# Patient Record
Sex: Female | Born: 1965 | Race: White | Hispanic: No | Marital: Single | Smoking: Current every day smoker
Health system: Southern US, Community
[De-identification: ages and names within clinical notes are randomized; demographics above are authoritative.]

## PROBLEM LIST (undated history)

## (undated) DIAGNOSIS — Z86010 Personal history of colonic polyps: Secondary | ICD-10-CM

## (undated) DIAGNOSIS — E119 Type 2 diabetes mellitus without complications: Secondary | ICD-10-CM

## (undated) DIAGNOSIS — K5901 Slow transit constipation: Secondary | ICD-10-CM

## (undated) HISTORY — DX: Personal history of colonic polyps: Z86.010

## (undated) HISTORY — DX: Slow transit constipation: K59.01

## (undated) HISTORY — PX: ABDOMINAL HYSTERECTOMY: SHX81

## (undated) HISTORY — PX: HERNIA REPAIR: SHX51

## (undated) HISTORY — PX: COLONOSCOPY W/ BIOPSIES: SHX1374

## (undated) HISTORY — PX: OTHER SURGICAL HISTORY: SHX169

## (undated) HISTORY — PX: POLYPECTOMY: SHX149

## (undated) HISTORY — PX: CHOLECYSTECTOMY: SHX55

## (undated) HISTORY — DX: Type 2 diabetes mellitus without complications: E11.9

---

## 2013-01-09 ENCOUNTER — Ambulatory Visit (INDEPENDENT_AMBULATORY_CARE_PROVIDER_SITE_OTHER): Payer: 59 | Admitting: Family Medicine

## 2013-01-09 VITALS — BP 132/90 | HR 108 | Temp 98.1°F | Resp 20 | Ht 65.5 in | Wt 215.4 lb

## 2013-01-09 DIAGNOSIS — E119 Type 2 diabetes mellitus without complications: Secondary | ICD-10-CM

## 2013-01-09 DIAGNOSIS — I10 Essential (primary) hypertension: Secondary | ICD-10-CM

## 2013-01-09 DIAGNOSIS — E1149 Type 2 diabetes mellitus with other diabetic neurological complication: Secondary | ICD-10-CM

## 2013-01-09 DIAGNOSIS — E1140 Type 2 diabetes mellitus with diabetic neuropathy, unspecified: Secondary | ICD-10-CM

## 2013-01-09 DIAGNOSIS — E785 Hyperlipidemia, unspecified: Secondary | ICD-10-CM

## 2013-01-09 DIAGNOSIS — F17200 Nicotine dependence, unspecified, uncomplicated: Secondary | ICD-10-CM

## 2013-01-09 DIAGNOSIS — Z720 Tobacco use: Secondary | ICD-10-CM

## 2013-01-09 DIAGNOSIS — E1142 Type 2 diabetes mellitus with diabetic polyneuropathy: Secondary | ICD-10-CM

## 2013-01-09 LAB — POCT URINALYSIS DIPSTICK
Bilirubin, UA: NEGATIVE
Ketones, UA: NEGATIVE
Leukocytes, UA: NEGATIVE
Nitrite, UA: NEGATIVE

## 2013-01-09 LAB — COMPREHENSIVE METABOLIC PANEL
ALT: 30 U/L (ref 0–35)
AST: 28 U/L (ref 0–37)
Albumin: 4.2 g/dL (ref 3.5–5.2)
Alkaline Phosphatase: 106 U/L (ref 39–117)
BUN: 17 mg/dL (ref 6–23)
Calcium: 9.9 mg/dL (ref 8.4–10.5)
Chloride: 99 mEq/L (ref 96–112)
Creat: 0.73 mg/dL (ref 0.50–1.10)
Glucose, Bld: 338 mg/dL — ABNORMAL HIGH (ref 70–99)
Potassium: 4.6 mEq/L (ref 3.5–5.3)
Sodium: 133 mEq/L — ABNORMAL LOW (ref 135–145)
Total Protein: 7.4 g/dL (ref 6.0–8.3)

## 2013-01-09 LAB — LIPID PANEL
Cholesterol: 232 mg/dL — ABNORMAL HIGH (ref 0–200)
HDL: 34 mg/dL — ABNORMAL LOW (ref >39)
VLDL: 46 mg/dL — ABNORMAL HIGH (ref 0–40)

## 2013-01-09 LAB — GLUCOSE, POCT (MANUAL RESULT ENTRY): POC Glucose: 316 mg/dl — AB (ref 70–99)

## 2013-01-09 MED ORDER — SIMVASTATIN 20 MG PO TABS: 20.0000 mg | ORAL_TABLET | Freq: Every evening | ORAL | Status: DC

## 2013-01-09 MED ORDER — TRAMADOL HCL 50 MG PO TABS: 50.0000 mg | ORAL_TABLET | Freq: Four times a day (QID) | ORAL | Status: DC | PRN

## 2013-01-09 MED ORDER — SITAGLIPTIN PHOSPHATE 100 MG PO TABS: 100.0000 mg | ORAL_TABLET | Freq: Every day | ORAL | Status: DC

## 2013-01-09 MED ORDER — AMITRIPTYLINE HCL 10 MG PO TABS: 10.0000 mg | ORAL_TABLET | Freq: Every day | ORAL | Status: DC

## 2013-01-09 MED ORDER — METFORMIN HCL 500 MG PO TABS: 500.0000 mg | ORAL_TABLET | Freq: Two times a day (BID) | ORAL | Status: DC

## 2013-01-09 MED ORDER — LISINOPRIL 5 MG PO TABS: 5.0000 mg | ORAL_TABLET | Freq: Every day | ORAL | Status: DC

## 2013-01-09 NOTE — Patient Instructions (Addendum)
Take the metformin one twice daily for one week, then one in the morning and 2 in the evening for one week, then up to 2 twice daily. If having too much loose stools or abdominal pains dropback one pill.  Take the Januvia 100 mg one tablet daily for diabetes  Continue taking the lisinopril and simvastatin for blood pressure and cholesterol and protecting the diabetic kidney  Begin taking aspirin 81 mg one daily which can help protect the diabetic kidney and reduce risk of heart attacks  Use the tramadol if you have had pain in your feet  Use the amitriptyline one or 2 at bedtime for burning in the feet  Return in 2 months, early December  Work on smoking cecession

## 2013-01-09 NOTE — Progress Notes (Signed)
Subjective: 47 year old lady who is here for a diabetic evaluation. 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 When she had her card done yesterday her sugar in her urine was greater than 2000. She had been on medication but has not get to her doctor and has not been labs from last month or so. Her doctors in [employer] and she works up here since he is like she needs a Armed forces operational officer area physician that she can get to during her workdays. She gets some exercise, not a lot of regular basis. She is supposed to be on cholesterol and blood pressure medications also, but has not been taking them. She had gestational diabetes 5 years ago, then had normal sugar post childbirth. Later she redeveloped her diabetes.  His family history for diabetes in her paternal grandmother and a couple of aunts and her sister.  Review of systems shows she has occasional headache. She gets some mild blurring of vision intermittently. No major problems with dry mouth. No chest pains palpitations. No respiratory problems. No GI or GU complaints. No muscular skeletal complaints. She has had some foot pain problems for which he occasionally takes medicines  Objective: Overweight lady in no major acute distress. Fundi appear benign with discs normal. No retinopathy was noted. Throat clear. Neck supple without nodes or thyromegaly. No carotid bruits. Chest is clear to auscultation. Heart regular without murmur scalp slight redness. And nontender. No ankle edema.  Assessment: Uncontrolled diabetes mellitus Probable neuropathy History of hyperlipidemia History of hypertension  Plan: Check labs and proceed from there  Results for orders placed in visit on 01/09/13  POCT URINALYSIS DIPSTICK      Result Value Range   Color, UA yellow     Clarity, UA clear     Glucose, UA >=1000     Bilirubin, UA neg     Ketones, UA neg     Spec Grav, UA >=1.030     Blood, UA large      pH, UA 5.0     Protein, UA 30     Urobilinogen, UA 0.2     Nitrite, UA neg     Leukocytes, UA Negative    GLUCOSE, POCT (MANUAL RESULT ENTRY)      Result Value Range   POC Glucose 316 (*) 70 - 99 mg/dl  POCT GLYCOSYLATED HEMOGLOBIN (HGB A1C)      Result Value Range   Hemoglobin A1C 10.1

## 2013-01-10 ENCOUNTER — Encounter: Payer: Self-pay | Admitting: Family Medicine

## 2013-01-10 LAB — MICROALBUMIN, URINE: Microalb, Ur: 18.5 mg/dL — ABNORMAL HIGH (ref 0.00–1.89)

## 2013-03-28 ENCOUNTER — Ambulatory Visit (INDEPENDENT_AMBULATORY_CARE_PROVIDER_SITE_OTHER): Payer: 59 | Admitting: Emergency Medicine

## 2013-03-28 VITALS — BP 120/78 | HR 117 | Temp 98.1°F | Resp 18 | Ht 67.0 in | Wt 212.0 lb

## 2013-03-28 DIAGNOSIS — I10 Essential (primary) hypertension: Secondary | ICD-10-CM

## 2013-03-28 DIAGNOSIS — G629 Polyneuropathy, unspecified: Secondary | ICD-10-CM

## 2013-03-28 DIAGNOSIS — E119 Type 2 diabetes mellitus without complications: Secondary | ICD-10-CM

## 2013-03-28 DIAGNOSIS — E785 Hyperlipidemia, unspecified: Secondary | ICD-10-CM

## 2013-03-28 DIAGNOSIS — G589 Mononeuropathy, unspecified: Secondary | ICD-10-CM

## 2013-03-28 LAB — LIPID PANEL
Cholesterol: 160 mg/dL (ref 0–200)
HDL: 31 mg/dL — ABNORMAL LOW (ref >39)
LDL Cholesterol: 101 mg/dL — ABNORMAL HIGH (ref 0–99)
Total CHOL/HDL Ratio: 5.2 Ratio
VLDL: 28 mg/dL (ref 0–40)

## 2013-03-28 LAB — COMPREHENSIVE METABOLIC PANEL
ALT: 23 U/L (ref 0–35)
Albumin: 4.3 g/dL (ref 3.5–5.2)
Alkaline Phosphatase: 89 U/L (ref 39–117)
BUN: 12 mg/dL (ref 6–23)
Creat: 0.73 mg/dL (ref 0.50–1.10)
Glucose, Bld: 124 mg/dL — ABNORMAL HIGH (ref 70–99)
Potassium: 4.7 mEq/L (ref 3.5–5.3)
Sodium: 139 mEq/L (ref 135–145)
Total Bilirubin: 0.4 mg/dL (ref 0.3–1.2)

## 2013-03-28 LAB — POCT GLYCOSYLATED HEMOGLOBIN (HGB A1C): Hemoglobin A1C: 7.1

## 2013-03-28 NOTE — Progress Notes (Addendum)
Subjective:  This chart was scribed for Courtney Chris, MD by Quintella Reichert, ED scribe.  This patient was seen in room [facility] Room 14 and the patient's care was started at 12:30 PM.   Patient ID: Courtney Lindsey, female    DOB: Jul 07, 1965, 47 y.o.   MRN: [id number]  Chief Complaint  Patient presents with  . dm check    HPI  HPI Comments: Courtney Lindsey is a 47 y.o. female with DM, HTN and hyperlipidemia who presents for a DM recheck.  Pt was last seen for her DM almost 3 months ago by Dr. Alwyn Ren.  She medicates with metformin and sitagliptin.  She states her blood sugar has been good and she has no complaints at this time.    She last ate a bowl of oatmeal this morning at 7:30 AM.    Pt has not seen an ophthalmologist yet.  She did receive a flu shot this year.    Patient Active Problem List   Diagnosis Date Noted  . Diabetes 01/09/2013  . Other and unspecified hyperlipidemia 01/09/2013  . HTN (hypertension) 01/09/2013  . Diabetic neuropathy, painful 01/09/2013  . Tobacco abuse 01/09/2013    Past Medical History  Diagnosis Date  . Diabetes mellitus without complication     Past Surgical History  Procedure Laterality Date  . Cholecystectomy    . Abdominal hysterectomy    . Hernia repair    . Cesarean section    . Exposed fistula      13 years ago    Prior to Admission medications   Medication Sig Start Date End Date Taking? Authorizing Provider  amitriptyline (ELAVIL) 10 MG tablet Take 1 tablet (10 mg total) by mouth at bedtime. 01/09/13  Yes Peyton Najjar, MD  lisinopril (PRINIVIL,ZESTRIL) 5 MG tablet Take 1 tablet (5 mg total) by mouth daily. 01/09/13  Yes Peyton Najjar, MD  metFORMIN (GLUCOPHAGE) 500 MG tablet Take 1 tablet (500 mg total) by mouth 2 (two) times daily with a meal. 01/09/13  Yes Peyton Najjar, MD  simvastatin (ZOCOR) 20 MG tablet Take 1 tablet (20 mg total) by mouth every evening. 01/09/13  Yes Peyton Najjar, MD  sitaGLIPtin (JANUVIA) 100 MG tablet  Take 1 tablet (100 mg total) by mouth daily. 01/09/13  Yes Peyton Najjar, MD  traMADol (ULTRAM) 50 MG tablet Take 1 tablet (50 mg total) by mouth every 6 (six) hours as needed for pain. 01/09/13  Yes Peyton Najjar, MD     Review of Systems  Constitutional: Negative for fatigue and unexpected weight change.  Respiratory: Negative for chest tightness and shortness of breath.   Cardiovascular: Negative for chest pain, palpitations and leg swelling.  Gastrointestinal: Negative for abdominal pain and blood in stool.  Neurological: Negative for dizziness, syncope and light-headedness.        Objective:   Physical Exam CONSTITUTIONAL: Well developed/well nourished HEAD: Normocephalic/atraumatic EYES: EOMI/PERRL ENMT: Mucous membranes moist NECK: supple no meningeal signs SPINE:entire spine nontender CV: S1/S2 noted, no murmurs/rubs/gallops noted LUNGS: Lungs are clear to auscultation bilaterally, no apparent distress ABDOMEN: soft, nontender, no rebound or guarding GU:no cva tenderness NEURO: Pt is awake/alert, moves all extremitiesx4 EXTREMITIES: pulses normal, full ROM SKIN: warm, color normal PSYCH: no abnormalities of mood noted Examination of the feet reveal normal sensation normal pulses no ulcerations no skin breakdown  BP 120/78  Pulse 117  Temp(Src) 98.1 F (36.7 C) (Oral)  Resp 18  Ht 5\' 7"  (1.702  m)  Wt 212 lb (96.163 kg)  BMI 33.20 kg/m2  SpO2 97%  Results for orders placed in visit on 03/28/13  GLUCOSE, POCT (MANUAL RESULT ENTRY)      Result Value Range   POC Glucose 124 (*) 70 - 99 mg/dl  POCT GLYCOSYLATED HEMOGLOBIN (HGB A1C)      Result Value Range   Hemoglobin A1C 7.1      Results for orders placed in visit on 03/28/13  GLUCOSE, POCT (MANUAL RESULT ENTRY)      Result Value Range   POC Glucose 124 (*) 70 - 99 mg/dl  POCT GLYCOSYLATED HEMOGLOBIN (HGB A1C)      Result Value Range   Hemoglobin A1C 7.1        Assessment & Plan:  Patient has dropped  her hemoglobin A1c 3 points. She is doing absolutely great. She continues to smoke and is going to work on that next year. She has had her flu shot we'll recheck in 4 months. 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    I personally performed the services described in this documentation, which was scribed in my presence. The recorded information has been reviewed and is accurate.

## 2013-04-10 ENCOUNTER — Telehealth: Payer: Self-pay

## 2013-04-10 NOTE — Telephone Encounter (Signed)
LMOM to CB to advise what other meds she has tried/failed for DM previously. A PA has been started and saved in [email or web address]

## 2013-04-28 NOTE — Telephone Encounter (Signed)
PA denied because pt has not had at least a 3 month trial of all alternatives which are Onglyza, Tradjenta and Nesina. I have put the denial letter/appeal info in Dr [employer] box. Dr Linna Darner, do you want to Rx one of the alternatives?

## 2013-04-30 NOTE — Telephone Encounter (Signed)
Call:  Their denial makes no sense, however please change to Onglyza 5 #30 Take one daily for diabetes.  Stop the Januvia  Return in 6 weeks for recheck

## 2013-05-01 MED ORDER — SAXAGLIPTIN HCL 5 MG PO TABS: 5.0000 mg | ORAL_TABLET | Freq: Every day | ORAL | Status: DC

## 2013-05-01 NOTE — Telephone Encounter (Signed)
LM for RTN call New Rx has been sent to the pharmacy Pt needs information on the medication RTC in 6 weeks for a recheck

## 2014-01-18 ENCOUNTER — Other Ambulatory Visit: Payer: Self-pay | Admitting: Family Medicine

## 2014-02-17 ENCOUNTER — Ambulatory Visit (INDEPENDENT_AMBULATORY_CARE_PROVIDER_SITE_OTHER): Payer: 59 | Admitting: Family Medicine

## 2014-02-17 ENCOUNTER — Other Ambulatory Visit: Payer: Self-pay | Admitting: Family Medicine

## 2014-02-17 VITALS — BP 112/76 | HR 101 | Temp 98.0°F | Resp 16 | Ht 67.0 in | Wt 217.6 lb

## 2014-02-17 DIAGNOSIS — H00013 Hordeolum externum right eye, unspecified eyelid: Secondary | ICD-10-CM

## 2014-02-17 DIAGNOSIS — N6320 Unspecified lump in the left breast, unspecified quadrant: Secondary | ICD-10-CM

## 2014-02-17 DIAGNOSIS — I10 Essential (primary) hypertension: Secondary | ICD-10-CM

## 2014-02-17 DIAGNOSIS — E1142 Type 2 diabetes mellitus with diabetic polyneuropathy: Secondary | ICD-10-CM

## 2014-02-17 DIAGNOSIS — E785 Hyperlipidemia, unspecified: Secondary | ICD-10-CM

## 2014-02-17 DIAGNOSIS — N63 Unspecified lump in breast: Secondary | ICD-10-CM

## 2014-02-17 DIAGNOSIS — Z720 Tobacco use: Secondary | ICD-10-CM

## 2014-02-17 DIAGNOSIS — N644 Mastodynia: Secondary | ICD-10-CM

## 2014-02-17 DIAGNOSIS — E1140 Type 2 diabetes mellitus with diabetic neuropathy, unspecified: Secondary | ICD-10-CM

## 2014-02-17 LAB — LIPID PANEL
Cholesterol: 200 mg/dL (ref 0–200)
HDL: 33 mg/dL — AB (ref >39)
LDL CALC: 130 mg/dL — AB (ref 0–99)
Total CHOL/HDL Ratio: 6.1 Ratio
Triglycerides: 184 mg/dL — ABNORMAL HIGH (ref <150)
VLDL: 37 mg/dL (ref 0–40)

## 2014-02-17 LAB — COMPREHENSIVE METABOLIC PANEL
ALT: 35 U/L (ref 0–35)
AST: 30 U/L (ref 0–37)
Albumin: 4.1 g/dL (ref 3.5–5.2)
Alkaline Phosphatase: 118 U/L — ABNORMAL HIGH (ref 39–117)
BILIRUBIN TOTAL: 0.5 mg/dL (ref 0.2–1.2)
BUN: 10 mg/dL (ref 6–23)
CO2: 26 mEq/L (ref 19–32)
CREATININE: 0.72 mg/dL (ref 0.50–1.10)
Calcium: 9.8 mg/dL (ref 8.4–10.5)
Chloride: 100 mEq/L (ref 96–112)
Glucose, Bld: 207 mg/dL — ABNORMAL HIGH (ref 70–99)
Potassium: 4.5 mEq/L (ref 3.5–5.3)
Sodium: 136 mEq/L (ref 135–145)
Total Protein: 7.3 g/dL (ref 6.0–8.3)

## 2014-02-17 LAB — POCT GLYCOSYLATED HEMOGLOBIN (HGB A1C): Hemoglobin A1C: 9.9

## 2014-02-17 MED ORDER — SIMVASTATIN 20 MG PO TABS: 20.0000 mg | ORAL_TABLET | Freq: Every evening | ORAL | Status: DC

## 2014-02-17 MED ORDER — AMITRIPTYLINE HCL 25 MG PO TABS: 25.0000 mg | ORAL_TABLET | Freq: Every day | ORAL | Status: DC

## 2014-02-17 MED ORDER — LISINOPRIL 5 MG PO TABS: ORAL_TABLET | ORAL | Status: DC

## 2014-02-17 MED ORDER — OFLOXACIN 0.3 % OP SOLN: 2.0000 [drp] | Freq: Four times a day (QID) | OPHTHALMIC | Status: DC

## 2014-02-17 MED ORDER — METFORMIN HCL 1000 MG PO TABS: 1000.0000 mg | ORAL_TABLET | Freq: Two times a day (BID) | ORAL | Status: DC

## 2014-02-17 MED ORDER — METFORMIN HCL 500 MG PO TABS: 500.0000 mg | ORAL_TABLET | Freq: Two times a day (BID) | ORAL | Status: DC

## 2014-02-17 MED ORDER — TRAMADOL HCL 50 MG PO TABS: 50.0000 mg | ORAL_TABLET | Freq: Four times a day (QID) | ORAL | Status: DC | PRN

## 2014-02-17 NOTE — Patient Instructions (Addendum)
You have appt at the [facility] Friday 02/19/14 @ 9:00 am. [address]. [phone] ph# [phone]  It is very important that you get this checked at that time.  Continue current medications for the diabetes, cholesterol, and blood pressure  Increase amitriptyline to 25 mg at bedtime to see if that will help your foot discomfort.  Use the tramadol sparingly only when you have bad pain  Use the eyedrops 1-2 drops right eye at least 4 times daily for the next few days, can actually use every 3-4 hours if you remember to.  Increase the metformin to 1000 mg twice daily  Return for recheck in 3 months  Work hard on stopping smoking  If any questions or concerns regarding the report they give you from the [facility] Center please feel free to contact me.

## 2014-02-17 NOTE — Progress Notes (Signed)
Subjective: Patient is here for a number of things. She is here for her regular diabetic checkup. She needs her regular medications refilled. She is on medicines for diabetes, cholesterol, blood pressure.  She has a lump in her left breast. She said she had pain in the left breast a month ago, then it went away. Over the last few days she's noticed a lump behind the left areola and has had some pain there. She sees slight redness at times. No family history of breast cancer. She has had a mammogram but has been a long time ago.  She has a red infected place on the medial aspect of her right upper eyelid that she is concerned about  She does try to watch her diet somewhat. She does not check blood sugars.  No headaches or dizziness. No chest pains or shortness of breath. No GI or GU complaints. Has some peripheral neuropathy symptoms with burning in her feet. She says amitriptyline is not really helping well for that he longer. She also has some problems with low back pains.  Objective: Vital signs stable H EENT TMs normal. Eyes PERRLA. Throat clear.. Her right eye has a little stye medially on the lower right upper eyelid just next to the epicanthus. Chest is clear. Heart regular without murmurs. She has large pendulous breasts. There is a little blackhead lesion just below the left nipple. There is also minimal irregularity of the skin at the nipple. She has a 2.5 cm firm circumscribed mass. It is directly behind the areola. Her nipple had no obvious discharge. No axillary nodes. Abdomen soft without mass or tenderness. Extremities without edema.  Assessment: Breast lump Diabetes mellitus type 2 Peripheral neuropathy, diabetic Leg pains and back pain Stye right eye Hypertension Hyperlipidemia  Plan: Try to get her in for breast workup as soon as possible. Looks like be Friday.  Check labs  Refill medications to  Treat the stye with ofloxacin  Results for orders placed or performed  in visit on 02/17/14  POCT glycosylated hemoglobin (Hb A1C)  Result Value Ref Range   Hemoglobin A1C 9.9

## 2014-02-18 ENCOUNTER — Other Ambulatory Visit: Payer: Self-pay | Admitting: Radiology

## 2014-02-18 ENCOUNTER — Telehealth: Payer: Self-pay

## 2014-02-18 ENCOUNTER — Encounter: Payer: Self-pay | Admitting: Family Medicine

## 2014-02-18 DIAGNOSIS — N6320 Unspecified lump in the left breast, unspecified quadrant: Secondary | ICD-10-CM

## 2014-02-18 NOTE — Telephone Encounter (Signed)
Patient went to pharmacy and all her meds were there except the traMADol (ULTRAM) 50 MG tablet Courtney Lindsey is faxing it over to the pharmacy.  Patient states she was not given a printed copy of the script.   [phone] (H)

## 2014-02-18 NOTE — Telephone Encounter (Signed)
Pt stated tramadol didn't get to pharm. I called it in to pharm.

## 2014-02-19 ENCOUNTER — Ambulatory Visit
Admission: RE | Admit: 2014-02-19 | Discharge: 2014-02-19 | Disposition: A | Discharge status: Home / Self Care | Payer: 59 | Source: Ambulatory Visit | Attending: Family Medicine | Admitting: Family Medicine

## 2014-02-19 ENCOUNTER — Other Ambulatory Visit: Payer: Self-pay | Admitting: Family Medicine

## 2014-02-19 DIAGNOSIS — N6320 Unspecified lump in the left breast, unspecified quadrant: Secondary | ICD-10-CM

## 2014-02-19 DIAGNOSIS — N644 Mastodynia: Secondary | ICD-10-CM

## 2014-02-20 NOTE — Telephone Encounter (Signed)
Spoke to patient. Had her biopsies yesterday. Results are pending. She will call me if she has further problems.

## 2014-02-22 ENCOUNTER — Other Ambulatory Visit: Payer: Self-pay | Admitting: Family Medicine

## 2014-02-22 DIAGNOSIS — N611 Abscess of the breast and nipple: Secondary | ICD-10-CM

## 2014-03-08 ENCOUNTER — Ambulatory Visit
Admission: RE | Admit: 2014-03-08 | Discharge: 2014-03-08 | Disposition: A | Discharge status: Home / Self Care | Payer: 59 | Source: Ambulatory Visit | Attending: Family Medicine | Admitting: Family Medicine

## 2014-03-08 DIAGNOSIS — N611 Abscess of the breast and nipple: Secondary | ICD-10-CM

## 2015-02-21 ENCOUNTER — Ambulatory Visit (INDEPENDENT_AMBULATORY_CARE_PROVIDER_SITE_OTHER): Payer: 59 | Admitting: Family Medicine

## 2015-02-21 ENCOUNTER — Telehealth: Payer: Self-pay | Admitting: Family Medicine

## 2015-02-21 ENCOUNTER — Other Ambulatory Visit: Payer: Self-pay | Admitting: Family Medicine

## 2015-02-21 VITALS — BP 128/80 | HR 110 | Temp 98.6°F | Resp 18 | Ht 67.0 in | Wt 210.4 lb

## 2015-02-21 DIAGNOSIS — E785 Hyperlipidemia, unspecified: Secondary | ICD-10-CM

## 2015-02-21 DIAGNOSIS — M6580 Other synovitis and tenosynovitis, unspecified site: Secondary | ICD-10-CM

## 2015-02-21 DIAGNOSIS — M76891 Other specified enthesopathies of right lower limb, excluding foot: Secondary | ICD-10-CM

## 2015-02-21 DIAGNOSIS — E1142 Type 2 diabetes mellitus with diabetic polyneuropathy: Secondary | ICD-10-CM

## 2015-02-21 DIAGNOSIS — E119 Type 2 diabetes mellitus without complications: Secondary | ICD-10-CM | POA: Diagnosis not present

## 2015-02-21 DIAGNOSIS — E1140 Type 2 diabetes mellitus with diabetic neuropathy, unspecified: Secondary | ICD-10-CM

## 2015-02-21 DIAGNOSIS — Z23 Encounter for immunization: Secondary | ICD-10-CM | POA: Diagnosis not present

## 2015-02-21 LAB — POCT GLYCOSYLATED HEMOGLOBIN (HGB A1C): Hemoglobin A1C: 10.9

## 2015-02-21 LAB — GLUCOSE, POCT (MANUAL RESULT ENTRY): POC Glucose: 275 mg/dl — AB (ref 70–99)

## 2015-02-21 LAB — COMPLETE METABOLIC PANEL WITH GFR
ALT: 45 U/L — ABNORMAL HIGH (ref 6–29)
AST: 55 U/L — ABNORMAL HIGH (ref 10–35)
Albumin: 4.3 g/dL (ref 3.6–5.1)
Alkaline Phosphatase: 116 U/L — ABNORMAL HIGH (ref 33–115)
BUN: 9 mg/dL (ref 7–25)
CO2: 25 mmol/L (ref 20–31)
Calcium: 9.7 mg/dL (ref 8.6–10.2)
Chloride: 100 mmol/L (ref 98–110)
Creat: 0.69 mg/dL (ref 0.50–1.10)
GFR, Est African American: >89 mL/min (ref >=60)
GFR, Est Non African American: >89 mL/min (ref >=60)
Glucose, Bld: 254 mg/dL — ABNORMAL HIGH (ref 65–99)
Potassium: 5.1 mmol/L (ref 3.5–5.3)
Sodium: 135 mmol/L (ref 135–146)
Total Bilirubin: 0.6 mg/dL (ref 0.2–1.2)
Total Protein: 7.4 g/dL (ref 6.1–8.1)

## 2015-02-21 LAB — MICROALBUMIN, URINE: Microalb, Ur: 66.9 mg/dL

## 2015-02-21 LAB — LIPID PANEL
Cholesterol: 163 mg/dL (ref 125–200)
HDL: 26 mg/dL — ABNORMAL LOW (ref >=46)
LDL Cholesterol: 98 mg/dL (ref <130)
Total CHOL/HDL Ratio: 6.3 Ratio — ABNORMAL HIGH (ref <=5.0)
Triglycerides: 193 mg/dL — ABNORMAL HIGH (ref <150)
VLDL: 39 mg/dL — ABNORMAL HIGH (ref <30)

## 2015-02-21 LAB — HEMOGLOBIN A1C: Hgb A1c MFr Bld: 10.9 % — AB (ref 4.0–6.0)

## 2015-02-21 MED ORDER — AMITRIPTYLINE HCL 25 MG PO TABS: 25.0000 mg | ORAL_TABLET | Freq: Every day | ORAL | Status: AC

## 2015-02-21 MED ORDER — GLIPIZIDE 5 MG PO TABS: 5.0000 mg | ORAL_TABLET | Freq: Two times a day (BID) | ORAL | Status: DC

## 2015-02-21 MED ORDER — METFORMIN HCL 1000 MG PO TABS: 1000.0000 mg | ORAL_TABLET | Freq: Two times a day (BID) | ORAL | Status: DC

## 2015-02-21 MED ORDER — SIMVASTATIN 20 MG PO TABS: 20.0000 mg | ORAL_TABLET | Freq: Every evening | ORAL | Status: DC

## 2015-02-21 MED ORDER — LISINOPRIL 5 MG PO TABS: ORAL_TABLET | ORAL | Status: DC

## 2015-02-21 MED ORDER — DICLOFENAC SODIUM 1 % TD GEL: 2.0000 g | Freq: Four times a day (QID) | TRANSDERMAL | Status: DC

## 2015-02-21 NOTE — Telephone Encounter (Addendum)
Patient needs a refill on her Tramadol. Looks like an electronic request came in from her pharmacy on 02/21/2015. Please notify patient when she can pick it up.  [phone]

## 2015-02-21 NOTE — Patient Instructions (Signed)
Diabetes is not well-controlled. I've ordered a new diabetes medicine to be taken with your metformin area this is inexpensive and should help bring that sugar down. Let's recheck your diabetes numbers in 3 months

## 2015-02-21 NOTE — Progress Notes (Addendum)
This chart was scribed for Robyn Haber, MD by Moises Blood, medical scribe at [facility].The patient was seen in exam room 2 and the patient's care was started at 9:20 AM.  Patient ID: Courtney Lindsey MRN: [id number], DOB: July 30, 1965, 49 y.o. Date of Encounter: 02/21/2015  Primary Physician: Pollyann Samples, MD  Chief Complaint:  Chief Complaint  Patient presents with  . Follow-up    for diabetes  . Knee Pain    right knee, achy pain  . Immunizations    flu shot    HPI:  Courtney Lindsey is a 49 y.o. female who presents to [facility] for follow up on her diabetes.  Lab Results  Component Value Date   HGBA1C 9.9 02/17/2014  She denies checking her sugar levels at home occasionally. She's currently on metformin. She's had DM for 6 years now. She also has neuropathy and she can feel it in her fingers now.   She also has some right achy knee pain. She notes that the pain radiates down her leg.   She was recommended for a flu vaccine received one today.  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   Past Medical History  Diagnosis Date  . Diabetes mellitus without complication ([place])      Home Meds: Prior to Admission medications   Medication Sig Start Date End Date Taking? Authorizing Provider  amitriptyline (ELAVIL) 25 MG tablet Take 1 tablet (25 mg total) by mouth at bedtime. 02/17/14  Yes Posey Boyer, MD  lisinopril (PRINIVIL,ZESTRIL) 5 MG tablet Take one daily for blood pressure and diabetes 02/17/14  Yes Posey Boyer, MD  metFORMIN (GLUCOPHAGE) 1000 MG tablet Take 1 tablet (1,000 mg total) by mouth 2 (two) times daily with a meal. 02/17/14  Yes Posey Boyer, MD  simvastatin (ZOCOR) 20 MG tablet Take 1 tablet (20 mg total) by mouth every evening. 02/17/14  Yes Posey Boyer, MD  traMADol (ULTRAM) 50 MG tablet Take 1 tablet (50 mg total) by mouth every 6 (six) hours as needed for moderate pain.  02/17/14  Yes Posey Boyer, MD  ofloxacin (OCUFLOX) 0.3 % ophthalmic solution Place 2 drops into the right eye 4 (four) times daily. Patient not taking: Reported on 02/21/2015 02/17/14   Posey Boyer, MD  saxagliptin HCl (ONGLYZA) 5 MG TABS tablet Take 1 tablet (5 mg total) by mouth daily. Patient not taking: Reported on 02/21/2015 05/01/13   Posey Boyer, MD    Allergies: No Known Allergies  Social History   Social History  . Marital Status: Single    Spouse Name: N/A  . Number of Children: N/A  . Years of Education: N/A   Occupational History  . Not on file.   Social History Main Topics  . Smoking status: Current Every Day Smoker    Types: Cigarettes    Start date: 01/09/1993  . Smokeless tobacco: Not on file  . Alcohol Use: No  . Drug Use: No  . Sexual Activity: Not on file   Other Topics Concern  . Not on file   Social History Narrative     Review of Systems: Constitutional: negative for fever, chills, night sweats, weight changes, or fatigue  HEENT: negative for vision changes, hearing loss, congestion, rhinorrhea, ST, epistaxis, or sinus pressure Cardiovascular: negative for chest pain or palpitations Respiratory: negative for hemoptysis, wheezing, shortness of breath, or cough Abdominal:  negative for abdominal pain, nausea, vomiting, diarrhea, or constipation Dermatological: negative for rash Neurologic: negative for headache, dizziness, or syncope Musc: positive for arthralgia (right knee)  All other systems reviewed and are otherwise negative with the exception to those above and in the HPI.  Physical Exam: Blood pressure 128/80, pulse 110, temperature 98.6 F (37 C), temperature source Oral, resp. rate 18, height 5\' 7"  (1.702 m), weight 210 lb 6.4 oz (95.437 kg), SpO2 98 %., Body mass index is 32.95 kg/(m^2). General: Well developed, well nourished, in no acute distress. Head: Normocephalic, atraumatic, eyes without discharge, sclera non-icteric, nares  are without discharge. Bilateral auditory canals clear, TM's are without perforation, pearly grey and translucent with reflective cone of light bilaterally. Oral cavity moist, posterior pharynx without exudate, erythema, peritonsillar abscess, or post nasal drip.  Neck: Supple. No thyromegaly. Full ROM. No lymphadenopathy. Lungs: Clear bilaterally to auscultation without wheezes, rales, or rhonchi. Breathing is unlabored. Heart: RRR with S1 S2. No murmurs, rubs, or gallops appreciated. Msk:  Strength and tone normal for age. Extremities/Skin: Warm and dry. No clubbing or cyanosis. Right knee, lateral aspect of proximal tibia, tender to light tapping, FROM, no STS or erythema Neuro: Alert and oriented X 3. Moves all extremities spontaneously. Gait is normal. CNII-XII grossly in tact. Psych:  Responds to questions appropriately with a normal affect.   Labs: Results for orders placed or performed in visit on 02/21/15  POCT glycosylated hemoglobin (Hb A1C)  Result Value Ref Range   Hemoglobin A1C 10.9   POCT glucose (manual entry)  Result Value Ref Range   POC Glucose 275 (A) 70 - 99 mg/dl     ASSESSMENT AND PLAN:  49 y.o. year old female with  This chart was scribed in my presence and reviewed by me personally.    ICD-9-CM ICD-10-CM   1. Type 2 diabetes mellitus not at goal [facility]) 250.00 E11.9 POCT glycosylated hemoglobin (Hb A1C)     POCT glucose (manual entry)     COMPLETE METABOLIC PANEL WITH GFR     Lipid panel     Microalbumin, urine  2. Tendonitis of knee, right 727.09 M65.80 diclofenac sodium (VOLTAREN) 1 % GEL  3. Diabetic neuropathy, painful (HCC) 250.60 E11.40 amitriptyline (ELAVIL) 25 MG tablet   357.2    4. Type 2 diabetes mellitus with diabetic polyneuropathy, without long-term current use of insulin (HCC) 250.60 E11.42 amitriptyline (ELAVIL) 25 MG tablet   357.2  lisinopril (PRINIVIL,ZESTRIL) 5 MG tablet     metFORMIN (GLUCOPHAGE) 1000 MG tablet     simvastatin (ZOCOR)  20 MG tablet  5. Hyperlipidemia 272.4 E78.5 simvastatin (ZOCOR) 20 MG tablet     By signing my name below, I, Moises Blood, attest that this documentation has been prepared under the direction and in the presence of Robyn Haber, MD. Electronically Signed: Moises Blood, [place]. 02/21/2015 , 9:20 AM .  Signed, Robyn Haber, MD 02/21/2015 9:20 AM

## 2015-02-22 NOTE — Telephone Encounter (Signed)
Pt seen today

## 2015-02-23 ENCOUNTER — Telehealth: Payer: Self-pay | Admitting: Family Medicine

## 2015-02-23 NOTE — Telephone Encounter (Signed)
Patient returned phone call about her lab values/ I gave her the following message from Dr. Joseph Art: "Notes Recorded by Robyn Haber, MD on 02/22/2015 at 3:25 PM Patient has abnormal lab values. We need to control sugar better.  There is a protein in the urine that is elevated and seen when the sugar is not well controlled. Let's recheck in 3 months, but work hard at keeping sugars below 150"   Patient understood and she will follow up in 3 months.

## 2015-03-07 ENCOUNTER — Encounter: Payer: Self-pay | Admitting: Family Medicine

## 2015-03-19 ENCOUNTER — Ambulatory Visit (INDEPENDENT_AMBULATORY_CARE_PROVIDER_SITE_OTHER): Payer: 59

## 2015-03-19 ENCOUNTER — Ambulatory Visit (INDEPENDENT_AMBULATORY_CARE_PROVIDER_SITE_OTHER): Payer: 59 | Admitting: Family Medicine

## 2015-03-19 VITALS — BP 130/90 | HR 112 | Temp 98.5°F | Resp 16 | Ht 67.5 in | Wt 212.0 lb

## 2015-03-19 DIAGNOSIS — M545 Low back pain, unspecified: Secondary | ICD-10-CM

## 2015-03-19 DIAGNOSIS — E1165 Type 2 diabetes mellitus with hyperglycemia: Secondary | ICD-10-CM

## 2015-03-19 DIAGNOSIS — M25561 Pain in right knee: Secondary | ICD-10-CM

## 2015-03-19 DIAGNOSIS — IMO0001 Reserved for inherently not codable concepts without codable children: Secondary | ICD-10-CM

## 2015-03-19 DIAGNOSIS — K5901 Slow transit constipation: Secondary | ICD-10-CM

## 2015-03-19 DIAGNOSIS — E1142 Type 2 diabetes mellitus with diabetic polyneuropathy: Secondary | ICD-10-CM

## 2015-03-19 LAB — COMPLETE METABOLIC PANEL WITH GFR
ALT: 56 U/L — ABNORMAL HIGH (ref 6–29)
AST: 69 U/L — ABNORMAL HIGH (ref 10–35)
Albumin: 4.4 g/dL (ref 3.6–5.1)
Alkaline Phosphatase: 94 U/L (ref 33–115)
BUN: 12 mg/dL (ref 7–25)
CO2: 24 mmol/L (ref 20–31)
Calcium: 9.6 mg/dL (ref 8.6–10.2)
Chloride: 104 mmol/L (ref 98–110)
Creat: 0.69 mg/dL (ref 0.50–1.10)
GFR, Est African American: >89 mL/min (ref >=60)
GFR, Est Non African American: >89 mL/min (ref >=60)
Glucose, Bld: 105 mg/dL — ABNORMAL HIGH (ref 65–99)
Potassium: 5.1 mmol/L (ref 3.5–5.3)
Sodium: 136 mmol/L (ref 135–146)
Total Bilirubin: 0.5 mg/dL (ref 0.2–1.2)
Total Protein: 7.5 g/dL (ref 6.1–8.1)

## 2015-03-19 LAB — POCT URINALYSIS DIP (MANUAL ENTRY)
Bilirubin, UA: NEGATIVE
Glucose, UA: NEGATIVE
Ketones, POC UA: NEGATIVE
Leukocytes, UA: NEGATIVE
Nitrite, UA: NEGATIVE
Protein Ur, POC: 30 — AB
Spec Grav, UA: 1.01
Urobilinogen, UA: 0.2
pH, UA: 5.5

## 2015-03-19 LAB — POC MICROSCOPIC URINALYSIS (UMFC): Mucus: ABSENT

## 2015-03-19 LAB — POCT CBC
Granulocyte percent: 62.1 %G (ref 37–80)
HCT, POC: 41.9 % (ref 37.7–47.9)
Hemoglobin: 15 g/dL (ref 12.2–16.2)
Lymph, poc: 3.5 — AB (ref 0.6–3.4)
MCH, POC: 34.1 pg — AB (ref 27–31.2)
MCHC: 35.8 g/dL — AB (ref 31.8–35.4)
MCV: 95.2 fL (ref 80–97)
MID (cbc): 0.6 (ref 0–0.9)
MPV: 8.4 fL (ref 0–99.8)
POC Granulocyte: 6.7 (ref 2–6.9)
POC LYMPH PERCENT: 32.6 %L (ref 10–50)
POC MID %: 5.3 %M (ref 0–12)
Platelet Count, POC: 182 10*3/uL (ref 142–424)
RBC: 4.4 M/uL (ref 4.04–5.48)
RDW, POC: 13.2 %
WBC: 10.8 10*3/uL — AB (ref 4.6–10.2)

## 2015-03-19 LAB — POCT SEDIMENTATION RATE: POCT SED RATE: 84 mm/hr — AB (ref 0–22)

## 2015-03-19 LAB — GLUCOSE, POCT (MANUAL RESULT ENTRY): POC Glucose: 112 mg/dl — AB (ref 70–99)

## 2015-03-19 LAB — POCT GLYCOSYLATED HEMOGLOBIN (HGB A1C): Hemoglobin A1C: 8.7

## 2015-03-19 LAB — HEMOGLOBIN A1C: HEMOGLOBIN A1C: 8.7 % — AB (ref 4.0–6.0)

## 2015-03-19 MED ORDER — LISINOPRIL 5 MG PO TABS: ORAL_TABLET | ORAL | Status: DC

## 2015-03-19 MED ORDER — PREDNISONE 20 MG PO TABS: ORAL_TABLET | ORAL | Status: DC

## 2015-03-19 NOTE — Addendum Note (Signed)
Addended by: Robyn Haber on: 03/19/2015 04:48 PM   Modules accepted: Orders

## 2015-03-19 NOTE — Progress Notes (Addendum)
Patient ID: Courtney Lindsey, female   DOB: 10/01/1965, 49 y.o.   MRN: [id number]    This chart was scribed for Courtney Haber, MD by [facility], medical scribe at [facility].The patient was seen in exam room 09 and the patient's care was started at 12:07 PM.  Patient ID: Courtney Lindsey MRN: [id number], DOB: 1965/11/12, 49 y.o. Date of Encounter: 03/19/2015  Primary Physician: Pollyann Samples, MD  Chief Complaint:  Chief Complaint  Patient presents with  . Follow-up    1 month recheck, started glipizide 1 month ago  . Medication Refill    lisinopril and tramadol  . Back Pain    R lower back pain, worse past few days   HPI:  Courtney Lindsey is a 49 y.o. female who presents to [facility] for a diabetes one month recheck.   Diabetes: Started medication one one month ago and she has been compliant with her medication but has not checked her blood sugar.  Low back pain: Also concerned about lower to mid back pain, worse when sitting down. Intermittent for the past 2 months and worse over the past two days. Constipated then diarrhea. She has noticed when she is constipated she develops this back pain.   Right knee pain: She is concerned about right knee aching that radiates down her leg. Any pressure shoots pain throughout her leg and knee.  Past Medical History  Diagnosis Date  . Diabetes mellitus without complication ([place])     Home Meds: Prior to Admission medications   Medication Sig Start Date End Date Taking? Authorizing Provider  amitriptyline (ELAVIL) 25 MG tablet Take 1 tablet (25 mg total) by mouth at bedtime. 02/21/15  Yes Courtney Haber, MD  diclofenac sodium (VOLTAREN) 1 % GEL Apply 2 g topically 4 (four) times daily. 02/21/15  Yes Courtney Haber, MD  glipiZIDE (GLUCOTROL) 5 MG tablet Take 1 tablet (5 mg total) by mouth 2 (two) times daily before a meal. 02/21/15  Yes Courtney Haber, MD  lisinopril (PRINIVIL,ZESTRIL) 5 MG tablet  Take one daily for blood pressure and diabetes 02/21/15  Yes Courtney Haber, MD  metFORMIN (GLUCOPHAGE) 1000 MG tablet Take 1 tablet (1,000 mg total) by mouth 2 (two) times daily with a meal. 02/21/15  Yes Courtney Haber, MD  simvastatin (ZOCOR) 20 MG tablet Take 1 tablet (20 mg total) by mouth every evening. 02/21/15  Yes Courtney Haber, MD  traMADol (ULTRAM) 50 MG tablet Take 1 tablet (50 mg total) by mouth every 6 (six) hours as needed for moderate pain. 02/17/14  Yes Posey Boyer, MD    Allergies: No Known Allergies  Social History   Social History  . Marital Status: Single    Spouse Name: N/A  . Number of Children: N/A  . Years of Education: N/A   Occupational History  . Not on file.   Social History Main Topics  . Smoking status: Current Every Day Smoker -- 1.00 packs/day for 25 years    Types: Cigarettes    Start date: 01/09/1993  . Smokeless tobacco: Not on file  . Alcohol Use: No  . Drug Use: No  . Sexual Activity: Not on file   Other Topics Concern  . Not on file   Social History Narrative    Review of Systems: Constitutional: negative for chills, fever, night sweats, weight changes, or fatigue  HEENT: negative for vision changes, hearing loss, congestion, rhinorrhea, ST, epistaxis, or sinus pressure  Cardiovascular: negative for chest pain or palpitations Respiratory: negative for hemoptysis, wheezing, shortness of breath, or cough Abdominal: negative for abdominal pain, nausea, vomiting. Positive for diarrhea and constipation. Msk: Positive for knee pain and back pain. Dermatological: negative for rash Neurologic: negative for headache, dizziness, or syncope All other systems reviewed and are otherwise negative with the exception to those above and in the HPI.  Physical Exam: Blood pressure 130/90, pulse 112, temperature 98.5 F (36.9 C), temperature source Oral, resp. rate 16, height 5' 7.5" (1.715 m), weight 212 lb (96.163 kg), SpO2 98 %., Body mass  index is 32.69 kg/(m^2). General: Well developed, well nourished, in no acute distress. Head: Normocephalic, atraumatic, eyes without discharge, sclera non-icteric, nares are without discharge. Bilateral auditory canals clear, TM's are without perforation, pearly grey and translucent with reflective cone of light bilaterally. Oral cavity moist, posterior pharynx without exudate, erythema, peritonsillar abscess, or post nasal drip.  Neck: Supple. No thyromegaly. Full ROM. No lymphadenopathy. Lungs: Clear bilaterally to auscultation without wheezes, rales, or rhonchi. Breathing is unlabored. Heart: RRR with S1 S2. No murmurs, rubs, or gallops appreciated. Abdomen: Soft, non-tender, non-distended with normoactive bowel sounds. No hepatomegaly. No rebound/guarding. No obvious abdominal masses. Msk:  Strength and tone normal for age. Extremities/Skin: Warm and dry. No clubbing or cyanosis. No edema. No rashes or suspicious lesions. Neuro: Alert and oriented X 3. Moves all extremities spontaneously. Gait is normal. CNII-XII grossly in tact. Psych:  Responds to questions appropriately with a normal affect.   Labs: Results for orders placed or performed in visit on 03/19/15  POCT SEDIMENTATION RATE  Result Value Ref Range   POCT SED RATE 84 (A) 0 - 22 mm/hr  POCT CBC  Result Value Ref Range   WBC 10.8 (A) 4.6 - 10.2 K/uL   Lymph, poc 3.5 (A) 0.6 - 3.4   POC LYMPH PERCENT 32.6 10 - 50 %L   MID (cbc) 0.6 0 - 0.9   POC MID % 5.3 0 - 12 %M   POC Granulocyte 6.7 2 - 6.9   Granulocyte percent 62.1 37 - 80 %G   RBC 4.40 4.04 - 5.48 M/uL   Hemoglobin 15.0 12.2 - 16.2 g/dL   HCT, POC 41.9 37.7 - 47.9 %   MCV 95.2 80 - 97 fL   MCH, POC 34.1 (A) 27 - 31.2 pg   MCHC 35.8 (A) 31.8 - 35.4 g/dL   RDW, POC 13.2 %   Platelet Count, POC 182 142 - 424 K/uL   MPV 8.4 0 - 99.8 fL  POCT glycosylated hemoglobin (Hb A1C)  Result Value Ref Range   Hemoglobin A1C 8.7   POCT glucose (manual entry)  Result Value  Ref Range   POC Glucose 112 (A) 70 - 99 mg/dl  POCT Microscopic Urinalysis ([facility])  Result Value Ref Range   WBC,UR,HPF,POC Few (A) None WBC/hpf   RBC,UR,HPF,POC Few (A) None RBC/hpf   Bacteria Few (A) None, Too numerous to count   Mucus Absent Absent   Epithelial Cells, UR Per Microscopy Few (A) None, Too numerous to count cells/hpf  POCT urinalysis dipstick  Result Value Ref Range   Color, UA yellow yellow   Clarity, UA clear clear   Glucose, UA negative negative   Bilirubin, UA negative negative   Ketones, POC UA negative negative   Spec Grav, UA 1.010    Blood, UA moderate (A) negative   pH, UA 5.5    Protein Ur, POC =30 (A) negative   Urobilinogen,  UA 0.2    Nitrite, UA Negative Negative   Leukocytes, UA Negative Negative   [facility] reading (PRIMARY) by  Dr. Joseph Art: There is soft tissue swelling of the right lateral knee. Spine films look remarkably clear of arthritis.  ASSESSMENT AND PLAN:  49 y.o. year old female with  This chart was scribed in my presence and reviewed by me personally.    ICD-9-CM ICD-10-CM   1. Uncontrolled type 2 diabetes mellitus without complication, without long-term current use of insulin (HCC) 250.02 123456 COMPLETE METABOLIC PANEL WITH GFR     POCT glycosylated hemoglobin (Hb A1C)     DG Lumbar Spine 2-3 Views     DG Abd 1 View     POCT glucose (manual entry)     POCT Microscopic Urinalysis ([facility])     POCT urinalysis dipstick  2. Right-sided low back pain without sciatica 724.2 M54.5 POCT SEDIMENTATION RATE     POCT CBC     POCT glucose (manual entry)     POCT Microscopic Urinalysis ([facility])     POCT urinalysis dipstick     Ambulatory referral to Gastroenterology  3. Right knee pain 719.46 M25.561 POCT SEDIMENTATION RATE     DG Knee 1-2 Views Right     POCT glucose (manual entry)     POCT Microscopic Urinalysis ([facility])     POCT urinalysis dipstick     predniSONE (DELTASONE) 20 MG tablet  4. Slow transit constipation 564.01 K59.01  Ambulatory referral to Gastroenterology  5. Type 2 diabetes mellitus with diabetic polyneuropathy, without long-term current use of insulin (HCC) 250.60 E11.42 lisinopril (PRINIVIL,ZESTRIL) 5 MG tablet   357.2      Markedly improved A1c result. I believe the slow transit time constipation is what's causing the back pain.  By signing my name below, I, Nadim Abuhashem, attest that this documentation has been prepared under the direction and in the presence of Courtney Haber, MD.  Electronically Signed: Lora Havens, medical scribe. 03/19/2015 12:15 PM.

## 2015-03-19 NOTE — Patient Instructions (Signed)
Please let me know if you're not improving with respect to her knee in the next 2-3 days. 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

## 2015-03-23 ENCOUNTER — Encounter: Payer: Self-pay | Admitting: Internal Medicine

## 2015-03-24 ENCOUNTER — Encounter: Payer: Self-pay | Admitting: Family Medicine

## 2015-03-27 ENCOUNTER — Other Ambulatory Visit: Payer: Self-pay | Admitting: *Deleted

## 2015-03-27 ENCOUNTER — Telehealth: Payer: Self-pay | Admitting: *Deleted

## 2015-03-27 DIAGNOSIS — E1140 Type 2 diabetes mellitus with diabetic neuropathy, unspecified: Secondary | ICD-10-CM

## 2015-03-27 NOTE — Telephone Encounter (Signed)
Please call GI to see if we can get her in sooner.  Pt is having pain and cannot wait until Feb.  She is schedule to see Silvano Rusk.

## 2015-03-27 NOTE — Telephone Encounter (Signed)
Pt would like a refill on tramadol.  Dr. Carlean Jews did not refill for her.

## 2015-03-28 NOTE — Telephone Encounter (Signed)
Left message for pt to call back  °

## 2015-03-28 NOTE — Telephone Encounter (Signed)
Spoke with pt, advised appt.

## 2015-03-28 NOTE — Telephone Encounter (Signed)
Jan 4 at 8:15 PA-C Laurie Hvozbovic.

## 2015-03-31 NOTE — Telephone Encounter (Signed)
Ask PA to refill Tramadol for her.  If pains are too bad needs to return.  Have her take only when needed.  I am out of office until Wednesday.

## 2015-04-01 MED ORDER — TRAMADOL HCL 50 MG PO TABS: 50.0000 mg | ORAL_TABLET | Freq: Four times a day (QID) | ORAL | Status: DC | PRN

## 2015-04-01 NOTE — Telephone Encounter (Signed)
Done

## 2015-04-01 NOTE — Telephone Encounter (Signed)
Faxed and LMOM for pt w/Dr Hopper's inst's. Asked for CB if any ?s and RTC or ER if worsens.

## 2015-04-13 ENCOUNTER — Encounter: Payer: Self-pay | Admitting: Physician Assistant

## 2015-04-13 ENCOUNTER — Ambulatory Visit (INDEPENDENT_AMBULATORY_CARE_PROVIDER_SITE_OTHER): Payer: 59 | Admitting: Physician Assistant

## 2015-04-13 ENCOUNTER — Other Ambulatory Visit (INDEPENDENT_AMBULATORY_CARE_PROVIDER_SITE_OTHER): Payer: 59

## 2015-04-13 VITALS — BP 124/80 | HR 84 | Ht 67.0 in | Wt 216.0 lb

## 2015-04-13 DIAGNOSIS — K5900 Constipation, unspecified: Secondary | ICD-10-CM

## 2015-04-13 DIAGNOSIS — R194 Change in bowel habit: Secondary | ICD-10-CM

## 2015-04-13 DIAGNOSIS — K589 Irritable bowel syndrome without diarrhea: Secondary | ICD-10-CM

## 2015-04-13 LAB — CBC WITH DIFFERENTIAL/PLATELET
BASOS PCT: 0.7 % (ref 0.0–3.0)
Basophils Absolute: 0.1 10*3/uL (ref 0.0–0.1)
EOS PCT: 2.2 % (ref 0.0–5.0)
Eosinophils Absolute: 0.2 10*3/uL (ref 0.0–0.7)
HEMATOCRIT: 42.1 % (ref 36.0–46.0)
HEMOGLOBIN: 14.1 g/dL (ref 12.0–15.0)
Lymphocytes Relative: 29 % (ref 12.0–46.0)
Lymphs Abs: 2.6 10*3/uL (ref 0.7–4.0)
MCHC: 33.5 g/dL (ref 30.0–36.0)
MCV: 98.4 fl (ref 78.0–100.0)
MONO ABS: 0.4 10*3/uL (ref 0.1–1.0)
Monocytes Relative: 4.4 % (ref 3.0–12.0)
Neutro Abs: 5.7 10*3/uL (ref 1.4–7.7)
Neutrophils Relative %: 63.7 % (ref 43.0–77.0)
Platelets: 184 10*3/uL (ref 150.0–400.0)
RBC: 4.28 Mil/uL (ref 3.87–5.11)
RDW: 13.4 % (ref 11.5–15.5)
WBC: 8.9 10*3/uL (ref 4.0–10.5)

## 2015-04-13 LAB — BASIC METABOLIC PANEL
BUN: 14 mg/dL (ref 6–23)
CO2: 24 mEq/L (ref 19–32)
CREATININE: 0.72 mg/dL (ref 0.40–1.20)
Calcium: 10.1 mg/dL (ref 8.4–10.5)
Chloride: 101 mEq/L (ref 96–112)
GFR: 91.43 mL/min (ref >60.00)
GLUCOSE: 269 mg/dL — AB (ref 70–99)
Potassium: 4.7 mEq/L (ref 3.5–5.1)
SODIUM: 135 meq/L (ref 135–145)

## 2015-04-13 LAB — TSH: TSH: 2.83 u[IU]/mL (ref 0.35–4.50)

## 2015-04-13 MED ORDER — NA SULFATE-K SULFATE-MG SULF 17.5-3.13-1.6 GM/177ML PO SOLN: 1.0000 | Freq: Once | ORAL | Status: DC
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
## 2015-04-13 NOTE — Progress Notes (Signed)
Agree w/ Ms. Hvozdovic's note and mangement.  

## 2015-04-13 NOTE — Patient Instructions (Signed)
Please do Gatorade/miralax purge thru Friday. Use 2 tablets of Ducolax prior to Gatorade.  After purge use Miralax 2-3 capfuls daily in 8 oz of water.  Increase your daily water intake.

## 2015-04-15 ENCOUNTER — Observation Stay (HOSPITAL_COMMUNITY)
Admission: EM | Admit: 2015-04-15 | Discharge: 2015-04-17 | Disposition: A | Discharge status: Home / Self Care | Payer: 59 | Attending: Internal Medicine | Admitting: Internal Medicine

## 2015-04-15 ENCOUNTER — Emergency Department (HOSPITAL_COMMUNITY): Payer: 59

## 2015-04-15 ENCOUNTER — Encounter: Payer: Self-pay | Admitting: *Deleted

## 2015-04-15 ENCOUNTER — Encounter: Payer: Self-pay | Admitting: Internal Medicine

## 2015-04-15 ENCOUNTER — Ambulatory Visit (AMBULATORY_SURGERY_CENTER): Payer: 59 | Admitting: Internal Medicine

## 2015-04-15 ENCOUNTER — Encounter (HOSPITAL_COMMUNITY): Payer: Self-pay | Admitting: Emergency Medicine

## 2015-04-15 VITALS — BP 131/59 | HR 92 | Temp 97.6°F | Resp 13 | Ht 67.0 in | Wt 216.0 lb

## 2015-04-15 DIAGNOSIS — K5909 Other constipation: Secondary | ICD-10-CM | POA: Insufficient documentation

## 2015-04-15 DIAGNOSIS — Z9049 Acquired absence of other specified parts of digestive tract: Secondary | ICD-10-CM | POA: Diagnosis not present

## 2015-04-15 DIAGNOSIS — E785 Hyperlipidemia, unspecified: Secondary | ICD-10-CM | POA: Diagnosis not present

## 2015-04-15 DIAGNOSIS — R509 Fever, unspecified: Secondary | ICD-10-CM | POA: Insufficient documentation

## 2015-04-15 DIAGNOSIS — D122 Benign neoplasm of ascending colon: Secondary | ICD-10-CM

## 2015-04-15 DIAGNOSIS — Z9889 Other specified postprocedural states: Secondary | ICD-10-CM | POA: Diagnosis not present

## 2015-04-15 DIAGNOSIS — R1031 Right lower quadrant pain: Principal | ICD-10-CM | POA: Insufficient documentation

## 2015-04-15 DIAGNOSIS — D123 Benign neoplasm of transverse colon: Secondary | ICD-10-CM | POA: Diagnosis not present

## 2015-04-15 DIAGNOSIS — D128 Benign neoplasm of rectum: Secondary | ICD-10-CM

## 2015-04-15 DIAGNOSIS — I10 Essential (primary) hypertension: Secondary | ICD-10-CM | POA: Diagnosis present

## 2015-04-15 DIAGNOSIS — Z720 Tobacco use: Secondary | ICD-10-CM | POA: Diagnosis not present

## 2015-04-15 DIAGNOSIS — D129 Benign neoplasm of anus and anal canal: Secondary | ICD-10-CM

## 2015-04-15 DIAGNOSIS — E1140 Type 2 diabetes mellitus with diabetic neuropathy, unspecified: Secondary | ICD-10-CM | POA: Diagnosis not present

## 2015-04-15 DIAGNOSIS — R194 Change in bowel habit: Secondary | ICD-10-CM

## 2015-04-15 DIAGNOSIS — F17210 Nicotine dependence, cigarettes, uncomplicated: Secondary | ICD-10-CM | POA: Diagnosis not present

## 2015-04-15 DIAGNOSIS — K635 Polyp of colon: Secondary | ICD-10-CM

## 2015-04-15 DIAGNOSIS — Z7984 Long term (current) use of oral hypoglycemic drugs: Secondary | ICD-10-CM | POA: Insufficient documentation

## 2015-04-15 DIAGNOSIS — D72829 Elevated white blood cell count, unspecified: Secondary | ICD-10-CM | POA: Diagnosis not present

## 2015-04-15 DIAGNOSIS — D124 Benign neoplasm of descending colon: Secondary | ICD-10-CM

## 2015-04-15 DIAGNOSIS — R109 Unspecified abdominal pain: Secondary | ICD-10-CM | POA: Diagnosis present

## 2015-04-15 DIAGNOSIS — K621 Rectal polyp: Secondary | ICD-10-CM

## 2015-04-15 LAB — CBC WITH DIFFERENTIAL/PLATELET
Basophils Absolute: 0.1 10*3/uL (ref 0.0–0.1)
Basophils Relative: 1 %
Eosinophils Absolute: 0.2 10*3/uL (ref 0.0–0.7)
Eosinophils Relative: 2 %
HEMATOCRIT: 40.1 % (ref 36.0–46.0)
Hemoglobin: 13.5 g/dL (ref 12.0–15.0)
LYMPHS ABS: 3.3 10*3/uL (ref 0.7–4.0)
LYMPHS PCT: 42 %
MCH: 33.5 pg (ref 26.0–34.0)
MCHC: 33.7 g/dL (ref 30.0–36.0)
MCV: 99.5 fL (ref 78.0–100.0)
MONOS PCT: 4 %
Monocytes Absolute: 0.4 10*3/uL (ref 0.1–1.0)
NEUTROS ABS: 4 10*3/uL (ref 1.7–7.7)
Neutrophils Relative %: 51 %
Platelets: 184 10*3/uL (ref 150–400)
RBC: 4.03 MIL/uL (ref 3.87–5.11)
RDW: 12.9 % (ref 11.5–15.5)
WBC: 7.9 10*3/uL (ref 4.0–10.5)

## 2015-04-15 LAB — GLUCOSE, CAPILLARY
GLUCOSE-CAPILLARY: 195 mg/dL — AB (ref 65–99)
Glucose-Capillary: 159 mg/dL — ABNORMAL HIGH (ref 65–99)

## 2015-04-15 LAB — COMPREHENSIVE METABOLIC PANEL
ALT: 46 U/L (ref 14–54)
ANION GAP: 10 (ref 5–15)
AST: 60 U/L — ABNORMAL HIGH (ref 15–41)
Albumin: 3.7 g/dL (ref 3.5–5.0)
Alkaline Phosphatase: 77 U/L (ref 38–126)
BUN: 15 mg/dL (ref 6–20)
CHLORIDE: 106 mmol/L (ref 101–111)
CO2: 24 mmol/L (ref 22–32)
CREATININE: 0.72 mg/dL (ref 0.44–1.00)
Calcium: 8.5 mg/dL — ABNORMAL LOW (ref 8.9–10.3)
GFR calc non Af Amer: >60 mL/min (ref >60)
Glucose, Bld: 164 mg/dL — ABNORMAL HIGH (ref 65–99)
POTASSIUM: 4.1 mmol/L (ref 3.5–5.1)
SODIUM: 140 mmol/L (ref 135–145)
Total Bilirubin: 0.6 mg/dL (ref 0.3–1.2)
Total Protein: 6.8 g/dL (ref 6.5–8.1)

## 2015-04-15 LAB — I-STAT CG4 LACTIC ACID, ED: Lactic Acid, Venous: 1.05 mmol/L (ref 0.5–2.0)

## 2015-04-15 LAB — LIPASE, BLOOD: Lipase: 18 U/L (ref 11–51)

## 2015-04-15 MED ORDER — ONDANSETRON HCL 4 MG/2ML IJ SOLN
4.0000 mg | Freq: Four times a day (QID) | INTRAMUSCULAR | Status: DC | PRN
  Filled 2015-04-15: qty 2

## 2015-04-15 MED ORDER — ACETAMINOPHEN 325 MG PO TABS: 650.0000 mg | ORAL_TABLET | Freq: Four times a day (QID) | ORAL | Status: DC | PRN

## 2015-04-15 MED ORDER — SIMVASTATIN 20 MG PO TABS
20.0000 mg | ORAL_TABLET | Freq: Every evening | ORAL | Status: DC
  Administered 2015-04-15 – 2015-04-16 (×2): 20 mg via ORAL
  Filled 2015-04-15 (×3): qty 1

## 2015-04-15 MED ORDER — AMITRIPTYLINE HCL 25 MG PO TABS
25.0000 mg | ORAL_TABLET | Freq: Every day | ORAL | Status: DC
  Administered 2015-04-15 – 2015-04-16 (×2): 25 mg via ORAL
  Filled 2015-04-15 (×3): qty 1

## 2015-04-15 MED ORDER — LISINOPRIL 5 MG PO TABS
5.0000 mg | ORAL_TABLET | Freq: Every day | ORAL | Status: DC
  Administered 2015-04-16 – 2015-04-17 (×2): 5 mg via ORAL
  Filled 2015-04-15 (×3): qty 1

## 2015-04-15 MED ORDER — SODIUM CHLORIDE 0.9 % IV SOLN: 500.0000 mL | INTRAVENOUS | Status: DC

## 2015-04-15 MED ORDER — CIPROFLOXACIN IN D5W 400 MG/200ML IV SOLN
400.0000 mg | Freq: Once | INTRAVENOUS | Status: AC
  Administered 2015-04-15: 400 mg via INTRAVENOUS
  Filled 2015-04-15: qty 200

## 2015-04-15 MED ORDER — METRONIDAZOLE IN NACL 5-0.79 MG/ML-% IV SOLN
500.0000 mg | Freq: Three times a day (TID) | INTRAVENOUS | Status: DC
  Administered 2015-04-16 – 2015-04-17 (×4): 500 mg via INTRAVENOUS
  Filled 2015-04-15 (×6): qty 100

## 2015-04-15 MED ORDER — INSULIN ASPART 100 UNIT/ML ~~LOC~~ SOLN
0.0000 [IU] | SUBCUTANEOUS | Status: DC
  Administered 2015-04-15 – 2015-04-16 (×7): 2 [IU] via SUBCUTANEOUS

## 2015-04-15 MED ORDER — METRONIDAZOLE IN NACL 5-0.79 MG/ML-% IV SOLN
500.0000 mg | Freq: Once | INTRAVENOUS | Status: AC
  Administered 2015-04-15: 500 mg via INTRAVENOUS
  Filled 2015-04-15: qty 100

## 2015-04-15 MED ORDER — ACETAMINOPHEN 650 MG RE SUPP: 650.0000 mg | Freq: Four times a day (QID) | RECTAL | Status: DC | PRN

## 2015-04-15 MED ORDER — CIPROFLOXACIN IN D5W 400 MG/200ML IV SOLN
400.0000 mg | Freq: Two times a day (BID) | INTRAVENOUS | Status: DC
  Administered 2015-04-16 – 2015-04-17 (×3): 400 mg via INTRAVENOUS
  Filled 2015-04-15 (×3): qty 200

## 2015-04-15 MED ORDER — MORPHINE SULFATE (PF) 2 MG/ML IV SOLN
2.0000 mg | INTRAVENOUS | Status: DC | PRN
  Administered 2015-04-15 – 2015-04-17 (×7): 2 mg via INTRAVENOUS
  Filled 2015-04-15 (×8): qty 1

## 2015-04-15 MED ORDER — SODIUM CHLORIDE 0.9 % IV BOLUS (SEPSIS)
1000.0000 mL | Freq: Once | INTRAVENOUS | Status: AC
  Administered 2015-04-15: 1000 mL via INTRAVENOUS

## 2015-04-15 MED ORDER — SODIUM CHLORIDE 0.9 % IV SOLN
INTRAVENOUS | Status: DC
  Administered 2015-04-15: 17:00:00 via INTRAVENOUS
  Administered 2015-04-16: 100 mL/h via INTRAVENOUS

## 2015-04-15 MED ORDER — ONDANSETRON HCL 4 MG PO TABS: 4.0000 mg | ORAL_TABLET | Freq: Four times a day (QID) | ORAL | Status: DC | PRN

## 2015-04-15 MED ORDER — HYDROMORPHONE HCL 1 MG/ML IJ SOLN
1.0000 mg | Freq: Once | INTRAMUSCULAR | Status: AC
  Administered 2015-04-15: 1 mg via INTRAVENOUS
  Filled 2015-04-15: qty 1

## 2015-04-15 MED ORDER — SODIUM CHLORIDE 0.9 % IV SOLN: Freq: Once | INTRAVENOUS | Status: DC
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
## 2015-04-15 NOTE — Progress Notes (Signed)
Stable to RR 

## 2015-04-15 NOTE — Op Note (Signed)
[place]  [address] [place], [address]   COLONOSCOPY PROCEDURE REPORT  PATIENT: Courtney Lindsey, Courtney Lindsey  MR#: [id number] BIRTHDATE: Oct 29, 1965 , 49  yrs. old GENDER: female ENDOSCOPIST: Gatha Mayer, MD, [facility] PROCEDURE DATE:  04/15/2015 PROCEDURE:   Colonoscopy, screening, Colonoscopy with snare polypectomy, and Submucosal injection, any substance First Screening Colonoscopy - Avg.  risk and is 50 yrs.  old or older - No.  Prior Negative Screening - Now for repeat screening. N/A  History of Adenoma - Now for follow-up colonoscopy & has been > or = to 3 yrs.  N/A  Polyps removed today? Yes ASA CLASS:   Class II INDICATIONS:Colorectal Neoplasm Risk Assessment for this procedure is average risk. MEDICATIONS: Propofol 600 mg IV, Monitored anesthesia care, and Lidocaine 40 mg IV  DESCRIPTION OF PROCEDURE:   After the risks benefits and alternatives of the procedure were thoroughly explained, informed consent was obtained.  The digital rectal exam revealed no abnormalities of the rectum.   The LB [id number]  endoscope was introduced through the anus and advanced to the cecum, which was identified by both the appendix and ileocecal valve. No adverse events experienced.   The quality of the prep was (MiraLax was used) good.  The instrument was then slowly withdrawn as the colon was fully examined. Estimated blood loss is zero unless otherwise noted in this procedure report.      COLON FINDINGS: A smooth flat polyp measuring 25 mm in size was found in the ascending colon.  Endoscopic mucosal resection was performed in a piecemeal fashion by injecting saline into the submucosa to raise the lesion.  Resection was then performed with snare cautery.  The polyp lifted well after submucosal injection The wound at the site was closed by placing hemoclips.  Five (5) placements were made.  Site marked w/ one distal SPOT injectionThree smooth sessile polyps ranging from  7 to 48mm in size were found in the descending colon and proximal transverse colon. Transverse were hot snare and descending 7 mm cold. 15 mm transverse polyp marked with single distal SPOT injection. Polypectomies were performed in a piecemeal fashion with a cold snare and using snare cautery.  The resection was complete, the polyp tissue was completely retrieved and sent to histology.   Two smooth and polypoid shaped sessile polyps measuring 5 mm in size were found in the rectosigmoid colon.  Polypectomies were performed with a cold snare.  The resection was complete, the polyp tissue was completely retrieved and sent to histology.   There was severe diverticulosis noted in the sigmoid colon.   The examination was otherwise normal.  Retroflexed views revealed no abnormalities. The time to cecum = 2.5 Withdrawal time = 31.9   The scope was withdrawn and the procedure completed. COMPLICATIONS: There were no immediate complications.  ENDOSCOPIC IMPRESSION: 1.   Flat polyp was found in the ascending colon; endoscopic mucosal resection was performed; the wound at the site was closed by placing hemoclips SPOT tattoo also 2.   Three sessile polyps ranging from 7 to 56mm in size were found in the descending colon and proximal transverse colon; polypectomies were performed in a piecemeal fashion with a cold snare and using snare cautery SPOT tattoo - 15 mm transverse area 3.   Two sessile polyps were found in the rectosigmoid colon; polypectomies were performed with a cold snare 4.   Severe diverticulosis was noted in the sigmoid colon 5.   The examination was otherwise normal  RECOMMENDATIONS: 1.  Hold Aspirin and all other NSAIDS for 2 weeks. 2.  Anticipate close f/u colonoscopy 3-6 months  eSigned:  Gatha Mayer, MD, [facility] 04/15/2015 11:45 AM   cc: Ruben Reason, MD and The Patient   PATIENT NAME:  Courtney Lindsey, Courtney Lindsey MR#: [id number]

## 2015-04-15 NOTE — ED Notes (Addendum)
Pt from [facility] post colonoscopy c/o RLQ pain and was sent by the physician that performed the procedure. Pt had numerous polyps removed during procedure as well as 5 clips placed. Pt received 800 ml NS and 50 mcg Fentanyl IV prior to leaving office with EMS. Pt is A&O and in NAD

## 2015-04-15 NOTE — Progress Notes (Signed)
Called to room to assist during endoscopic procedure.  Patient ID and intended procedure confirmed with present staff. Received instructions for my participation in the procedure from the performing physician.  

## 2015-04-15 NOTE — H&P (Addendum)
[facility] History and Physical  Courtney Lindsey [id number] DOB: 01-17-1966 DOA: 04/15/2015  Referring physician: Dr. Carlean Purl PCP: Ruben Reason, MD   Chief Complaint:  Abdominal pain status post colonoscopy with polypectomy  HPI:  50 year old female with history of type 2 diabetes mellitus on oral hypoglycemics, hypertension, hyperlipidemia, who underwent screening colonoscopy by Dr. Carlean Purl today with snare polypectomy (2.5 cm flat polyp removed from ascending colon, 3 sessile polyps ranging from 7-50 mm in the descending and proximal transverse colon with polypectomies done followed electrocautery, 2 sessile polyps in the rectosigmoid junction followed by polypectomies). Patient tolerated procedure well but after recovering from anesthesia developed severe abdominal pain. The pain was on her right lower quadrant stabbing in nature/10/10 in severity, no aggravating or relieving factors. She denies any fevers, chills, nausea, vomiting, shortness of breath, chest pain, palpitations, urinary symptoms. Is able to pass gas.  Patient was sent to the ED. In the ED blood will done showed no leukocytosis, normal hemoglobin and platelets. Chemistry was unremarkable. Lactic acid was normal. Abdominal x-ray done showed metallic clips in the right lower quadrant otherwise unremarkable without any signs of obstruction or perforation.  Review of Systems:  As outlined in history of present illness, 12 point review of systems otherwise unremarkable.   Past Medical History  Diagnosis Date  . Diabetes mellitus without complication ([place])   . Slow transit constipation    Past Surgical History  Procedure Laterality Date  . Cholecystectomy    . Abdominal hysterectomy    . Hernia repair    . Cesarean section    . Exposed fistula      13 years ago   Social History:  reports that she has been smoking Cigarettes.  She started smoking about 22 years ago. She has a 25 pack-year smoking history. She has  never used smokeless tobacco. She reports that she drinks alcohol. She reports that she does not use illicit drugs.  No Known Allergies  Family History  Problem Relation Age of Onset  . Heart disease Father     CHF  . Diabetes Sister   . Diabetes Paternal Grandmother     Prior to Admission medications   Medication Sig Start Date End Date Taking? Authorizing Provider  acetaminophen (TYLENOL) 500 MG tablet Take 500 mg by mouth every 6 (six) hours as needed (Pain).   Yes Historical Provider, MD  amitriptyline (ELAVIL) 25 MG tablet Take 1 tablet (25 mg total) by mouth at bedtime. 02/21/15  Yes Robyn Haber, MD  glipiZIDE (GLUCOTROL) 5 MG tablet Take 1 tablet (5 mg total) by mouth 2 (two) times daily before a meal. 02/21/15  Yes Robyn Haber, MD  lisinopril (PRINIVIL,ZESTRIL) 5 MG tablet Take one daily for blood pressure and diabetes Patient taking differently: Take 5 mg by mouth daily. Take one daily for blood pressure and diabetes 03/19/15  Yes Robyn Haber, MD  metFORMIN (GLUCOPHAGE) 1000 MG tablet Take 1 tablet (1,000 mg total) by mouth 2 (two) times daily with a meal. 02/21/15  Yes Robyn Haber, MD  simvastatin (ZOCOR) 20 MG tablet Take 1 tablet (20 mg total) by mouth every evening. 02/21/15  Yes Robyn Haber, MD  traMADol (ULTRAM) 50 MG tablet Take 50 mg by mouth every 6 (six) hours as needed (Pain).   Yes Historical Provider, MD  diclofenac sodium (VOLTAREN) 1 % GEL Apply 2 g topically 4 (four) times daily. Patient not taking: Reported on 04/15/2015 02/21/15   Robyn Haber, MD     Physical Exam:  Filed Vitals:   04/15/15 1240 04/15/15 1415 04/15/15 1513 04/15/15 1604  BP: 110/69 109/74 121/82 114/72  Pulse: 83 98 107 104  Temp: 97.6 F (36.4 C)  97.9 F (36.6 C) 97.8 F (36.6 C)  TempSrc: Oral  Oral Oral  Resp: 18 18 18 17   SpO2: 98% 96% 93% 93%    Constitutional: Vital signs reviewed.  Middle aged female not in distress, appears fatigued HEENT: no pallor,  no icterus, dry oral mucosa, no cervical lymphadenopathy Cardiovascular: RRR, S1 normal, S2 normal, no MRG Chest: CTAB, no wheezes, rales, or rhonchi Abdominal: Soft.  non-distended, bowel sounds are normal, right lower quadrant tenderness to palpation, no guarding or rigidity Ext: warm, no edema Neurological: Alert and oriented  Labs on Admission:  Basic Metabolic Panel:  Recent Labs Lab 04/13/15 0934 04/15/15 1304  NA 135 140  K 4.7 4.1  CL 101 106  CO2 24 24  GLUCOSE 269* 164*  BUN 14 15  CREATININE 0.72 0.72  CALCIUM 10.1 8.5*   Liver Function Tests:  Recent Labs Lab 04/15/15 1304  AST 60*  ALT 46  ALKPHOS 77  BILITOT 0.6  PROT 6.8  ALBUMIN 3.7    Recent Labs Lab 04/15/15 1304  LIPASE 18   No results for input(s): AMMONIA in the last 168 hours. CBC:  Recent Labs Lab 04/13/15 0934 04/15/15 1304  WBC 8.9 7.9  NEUTROABS 5.7 4.0  HGB 14.1 13.5  HCT 42.1 40.1  MCV 98.4 99.5  PLT 184.0 184   Cardiac Enzymes: No results for input(s): CKTOTAL, CKMB, CKMBINDEX, TROPONINI in the last 168 hours. BNP: Invalid input(s): POCBNP CBG: No results for input(s): GLUCAP in the last 168 hours.  Radiological Exams on Admission: Dg Abd Acute W/chest  04/15/2015  CLINICAL DATA:  Abdominal pain after colonoscopy today. EXAM: DG ABDOMEN ACUTE W/ 1V CHEST COMPARISON:  03/19/2015 FINDINGS: There is no evidence of dilated bowel loops or free intraperitoneal air. No radiopaque calculi or other significant radiographic abnormality is seen. Heart size and mediastinal contours are within normal limits. Both lungs are clear. There are multiple new clips in the right lower quadrant of the abdomen with slight soft tissue prominence around the clips as well as surgical clips in the right upper quadrant from previous cholecystectomy. IMPRESSION: New metallic clips in the right lower quadrant. Otherwise benign-appearing abdomen and pelvis. Electronically Signed   By: Lorriane Shire M.D.    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 Post polypectomy electrocoagulation syndrome is an uncommon phenomenon that occurs when electrical current applied during polypectomy extends into the muscularis and serosa resulting in contrast mural burn without perforation. It is commonly seen after removal of sessile polyps >2 cm in size with a higher amount of thermal energy applied over a long period of time. Due to irritation of the serosa it causes a local inflammatory response with symptoms similar to localized peritonitis.  -Dr. Carlean Purl recommended admitting patient to hospitalist service under observation. -No signs of perforation or obstruction on abdominal x-ray. Keep nothing by mouth. Supportive care with IV fluids, IV morphine 2 mg every 4 hours as needed, antiemetics and empiric antibiotics with IV ciprofloxacin and Flagyl. Serial abdominal exam. -[facility] will see patient in the morning.   Active Problems: Type 2 diabetes mellitus with neuropathy Monitor on sensitive sliding scale as patient is nothing by mouth. Hold glipizide  and metformin.  Essential hypertension Resume lisinopril.  Hyperlipidemia Continue statin  Tobacco abuse Counseled on cessation.   DVT prophylaxis: SCDs Diet: Nothing by mouth     Diet: Nothing by mouth  DVT prophylaxis: SCDs   Code Status: full code Family Communication: discussed with patient and her family at bedside Disposition Plan: [place] under observation  Louellen Molder [facility] Pager [phone]  Total time spent on admission :50 minutes  If 7PM-7AM, please contact night-coverage [email or web address] Password [email or web address] 04/15/2015, 4:08 PM

## 2015-04-15 NOTE — ED Provider Notes (Signed)
CSN: [id number]     Arrival date & time 04/15/15  1231 History   First MD Initiated Contact with Patient 04/15/15 1241     Chief Complaint  Patient presents with  . Abdominal Pain     (Consider location/radiation/quality/duration/timing/severity/associated sxs/prior Treatment) HPI  50 year old female presents with acute, severe right lower quadrant pain that started after waking up from anesthesia. She had just had a colonoscopy by Dr. Carlean Purl with multiple polyps removed. His note indicates she had a very large polyp removed with cauterization. Patient had a colonoscopy due to her age and chronic constipation. Patient's pain is slowly improving but still remains significant to her. No vomiting. Sent over by GI for an x-ray to rule out free air.  Past Medical History  Diagnosis Date  . Diabetes mellitus without complication ([place])   . Slow transit constipation    Past Surgical History  Procedure Laterality Date  . Cholecystectomy    . Abdominal hysterectomy    . Hernia repair    . Cesarean section    . Exposed fistula      13 years ago   Family History  Problem Relation Age of Onset  . Heart disease Father     CHF  . Diabetes Sister   . Diabetes Paternal Grandmother    Social History  Substance Use Topics  . Smoking status: Current Every Day Smoker -- 1.00 packs/day for 25 years    Types: Cigarettes    Start date: 01/09/1993  . Smokeless tobacco: Never Used     Comment: using vapors to try and quit 04/15/2015  . Alcohol Use: 0.0 oz/week    0 Standard drinks or equivalent per week     Comment: rarely   OB History    No data available     Review of Systems  Constitutional: Negative for fever.  Gastrointestinal: Positive for abdominal pain and constipation. Negative for nausea and vomiting.  All other systems reviewed and are negative.     Allergies  Review of patient's allergies indicates no known allergies.  Home Medications   Prior to Admission medications    Medication Sig Start Date End Date Taking? Authorizing Provider  amitriptyline (ELAVIL) 25 MG tablet Take 1 tablet (25 mg total) by mouth at bedtime. 02/21/15   Robyn Haber, MD  diclofenac sodium (VOLTAREN) 1 % GEL Apply 2 g topically 4 (four) times daily. 02/21/15   Robyn Haber, MD  glipiZIDE (GLUCOTROL) 5 MG tablet Take 1 tablet (5 mg total) by mouth 2 (two) times daily before a meal. 02/21/15   Robyn Haber, MD  lisinopril (PRINIVIL,ZESTRIL) 5 MG tablet Take one daily for blood pressure and diabetes 03/19/15   Robyn Haber, MD  metFORMIN (GLUCOPHAGE) 1000 MG tablet Take 1 tablet (1,000 mg total) by mouth 2 (two) times daily with a meal. 02/21/15   Robyn Haber, MD  simvastatin (ZOCOR) 20 MG tablet Take 1 tablet (20 mg total) by mouth every evening. 02/21/15   Robyn Haber, MD   BP 110/69 mmHg  Pulse 83  Temp(Src) 97.6 F (36.4 C) (Oral)  Resp 18  SpO2 98% Physical Exam  Constitutional: She is oriented to person, place, and time. She appears well-developed and well-nourished.  HENT:  Head: Normocephalic and atraumatic.  Right Ear: External ear normal.  Left Ear: External ear normal.  Nose: Nose normal.  Eyes: Right eye exhibits no discharge. Left eye exhibits no discharge.  Cardiovascular: Normal rate, regular rhythm and normal heart sounds.   Pulmonary/Chest: Effort normal  and breath sounds normal.  Abdominal: Soft. There is tenderness in the right lower quadrant, suprapubic area and left lower quadrant. There is no rigidity.  Neurological: She is alert and oriented to person, place, and time.  Skin: Skin is warm and dry.  Nursing note and vitals reviewed.   ED Course  Procedures (including critical care time) Labs Review Labs Reviewed  COMPREHENSIVE METABOLIC PANEL - Abnormal; Notable for the following:    Glucose, Bld 164 (*)    Calcium 8.5 (*)    AST 60 (*)    All other components within normal limits  CBC WITH DIFFERENTIAL/PLATELET  LIPASE, BLOOD   I-STAT CG4 LACTIC ACID, ED    Imaging Review Dg Abd Acute W/chest  04/15/2015  CLINICAL DATA:  Abdominal pain after colonoscopy today. EXAM: DG ABDOMEN ACUTE W/ 1V CHEST COMPARISON:  03/19/2015 FINDINGS: There is no evidence of dilated bowel loops or free intraperitoneal air. No radiopaque calculi or other significant radiographic abnormality is seen. Heart size and mediastinal contours are within normal limits. Both lungs are clear. There are multiple new clips in the right lower quadrant of the abdomen with slight soft tissue prominence around the clips as well as surgical clips in the right upper quadrant from previous cholecystectomy. IMPRESSION: New metallic clips in the right lower quadrant. Otherwise benign-appearing abdomen and pelvis. Electronically Signed   By: Lorriane Shire M.D.   On: 04/15/2015 13:46   I have personally reviewed and evaluated these images and lab results as part of my medical decision-making.   EKG Interpretation None      MDM   Final diagnoses:  Status post colonoscopy with polypectomy  Right lower quadrant abdominal pain    Patient with abdominal tenderness but no signs of peritonitis. X-ray is unremarkable for free air. While this does not fully rule out perforation her abdominal pain is slowly improved and thus I doubt perforation. Dr. 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 Recommends IV fluids, bowel rest, and antibiotics (Cipro and Flagyl). Place in observation to the hospitalist and he will see tomorrow. Patient agrees to plan. Low suspicion this is an actual perforation, but if her pain worsens she would need a CT scan.    Sherwood Gambler, MD 04/15/15 [id number]

## 2015-04-15 NOTE — Progress Notes (Signed)
   Discussed w/ Dr. Regenia Skeeter - pain a little better No free air on Xray WBC ok  Seems like obs admit for pain control, clear liqs, Abx and reassess tomorrow makes sense  Have asked him to try this with TRH unless there is a significant reduction in pain, etc - it is reasonable to try oral meds as outpatient but I lean towards obs admit  Gatha Mayer, MD, [facility] Gastroenterology [phone] (pager) [phone] after 5 PM, weekends and holidays  04/15/2015 3:00 PM

## 2015-04-15 NOTE — Progress Notes (Signed)
See Dr. Celesta Aver note.  Per D.O.- EMS called and Fentanyl 25 mcg ordered.  Given at 65 per [facility]; pt rates abd pain as a '10.'  Still tearful and crying out.  At 1200- pt still c/o abd pain as a "10'  Per Dr. Celesta Aver DO, Fentanyl 25 mcg given.  Given at 1201 per Delos Haring RN.  At 1208, abd pain is a "5".    EMS here at 1212 and report given to paramedic.  Transferred to [employer] [facility] [employer] per EMS

## 2015-04-15 NOTE — ED Notes (Addendum)
Hospitalist at bedside, will transfer up stairs when done.

## 2015-04-15 NOTE — Patient Instructions (Signed)
   I found and removed 6 polyps - all look benign but 2 were large.  You also have a condition called diverticulosis - common and not usually a problem but likely contributing to your constipation. Please read the handout provided and try to have a high fiber diet.  I will let you know pathology results and when to have another routine colonoscopy by mail. Given the size of these polyps you will need a repeat exam in 3-6 months I think.   I appreciate the opportunity to care for you. Gatha Mayer, MD, [facility]

## 2015-04-16 DIAGNOSIS — E785 Hyperlipidemia, unspecified: Secondary | ICD-10-CM | POA: Diagnosis not present

## 2015-04-16 DIAGNOSIS — R1031 Right lower quadrant pain: Secondary | ICD-10-CM | POA: Diagnosis not present

## 2015-04-16 DIAGNOSIS — I10 Essential (primary) hypertension: Secondary | ICD-10-CM | POA: Diagnosis not present

## 2015-04-16 DIAGNOSIS — D72829 Elevated white blood cell count, unspecified: Secondary | ICD-10-CM | POA: Diagnosis not present

## 2015-04-16 DIAGNOSIS — Z9889 Other specified postprocedural states: Secondary | ICD-10-CM | POA: Diagnosis not present

## 2015-04-16 LAB — BASIC METABOLIC PANEL
Anion gap: 9 (ref 5–15)
BUN: 14 mg/dL (ref 6–20)
CALCIUM: 8.8 mg/dL — AB (ref 8.9–10.3)
CHLORIDE: 108 mmol/L (ref 101–111)
CO2: 23 mmol/L (ref 22–32)
CREATININE: 0.88 mg/dL (ref 0.44–1.00)
GFR calc Af Amer: >60 mL/min (ref >60)
GFR calc non Af Amer: >60 mL/min (ref >60)
Glucose, Bld: 159 mg/dL — ABNORMAL HIGH (ref 65–99)
Potassium: 4.6 mmol/L (ref 3.5–5.1)
SODIUM: 140 mmol/L (ref 135–145)

## 2015-04-16 LAB — CBC
HCT: 41.5 % (ref 36.0–46.0)
Hemoglobin: 13.2 g/dL (ref 12.0–15.0)
MCH: 32.9 pg (ref 26.0–34.0)
MCHC: 31.8 g/dL (ref 30.0–36.0)
MCV: 103.5 fL — AB (ref 78.0–100.0)
PLATELETS: 194 10*3/uL (ref 150–400)
RBC: 4.01 MIL/uL (ref 3.87–5.11)
RDW: 13.4 % (ref 11.5–15.5)
WBC: 14.1 10*3/uL — AB (ref 4.0–10.5)

## 2015-04-16 LAB — GLUCOSE, CAPILLARY
GLUCOSE-CAPILLARY: 154 mg/dL — AB (ref 65–99)
GLUCOSE-CAPILLARY: 157 mg/dL — AB (ref 65–99)
GLUCOSE-CAPILLARY: 163 mg/dL — AB (ref 65–99)
GLUCOSE-CAPILLARY: 168 mg/dL — AB (ref 65–99)
GLUCOSE-CAPILLARY: 207 mg/dL — AB (ref 65–99)
Glucose-Capillary: 160 mg/dL — ABNORMAL HIGH (ref 65–99)
Glucose-Capillary: 244 mg/dL — ABNORMAL HIGH (ref 65–99)

## 2015-04-16 MED ORDER — INSULIN ASPART 100 UNIT/ML ~~LOC~~ SOLN
0.0000 [IU] | Freq: Three times a day (TID) | SUBCUTANEOUS | Status: DC
Start: 2015-04-17 — End: 2015-04-17
  Administered 2015-04-17: 2 [IU] via SUBCUTANEOUS

## 2015-04-16 MED ORDER — MORPHINE SULFATE (PF) 2 MG/ML IV SOLN
2.0000 mg | Freq: Once | INTRAVENOUS | Status: AC
  Administered 2015-04-16: 2 mg via INTRAVENOUS
  Filled 2015-04-16: qty 1

## 2015-04-16 MED ORDER — HYDROCODONE-ACETAMINOPHEN 5-325 MG PO TABS
1.0000 | ORAL_TABLET | ORAL | Status: DC | PRN
  Administered 2015-04-16 – 2015-04-17 (×3): 2 via ORAL
  Filled 2015-04-16 (×3): qty 2

## 2015-04-16 NOTE — Progress Notes (Signed)
PROGRESS NOTE  Courtney Lindsey [id number] DOB: April 02, 1966 DOA: 04/15/2015 PCP: Ruben Reason, MD   HPI: 50 yo F with DM2, HTN, HLD, admitted on 1/6 with abdominal pain post colonoscopy  Subjective / 24 H Interval events - ongoing abdominal pain, appreciates improvement   Assessment/Plan: Principal Problem:   Abdominal pain Active Problems:   Hyperlipidemia   HTN (hypertension)   Diabetic neuropathy, painful (HCC)   Tobacco abuse   Status post colonoscopy with polypectomy    Acute abdominal pain possibly due to post polypectomy electrocoagulation syndrome - Symptoms developed after waking up from anesthesia. Post polypectomy electrocoagulation syndrome is an uncommon phenomenon that occurs when electrical current applied during polypectomy extends into the muscularis and serosa resulting in contrast mural burn without perforation. It is commonly seen after removal of sessile polyps >2 cm in size with a higher amount of thermal energy applied over a long period of time. Due to irritation of the serosa it causes a local inflammatory response with symptoms similar to localized peritonitis.  - Dr. Carlean Purl recommended admitting patient to hospitalist service under observation, GI following, appreciate input - No signs of perforation or obstruction on abdominal x-ray.  - improving today, will allow clears - continue supportive treatment with antiemetics, pain control, empiric antibiotics with Ciprofloxacin and Metronidazole   Type 2 diabetes mellitus with neuropathy - Monitor on sensitive sliding scale  - Hold glipizide and metformin.  Essential hypertension - Resume lisinopril.  Hyperlipidemia - Continue statin  Tobacco abuse - Counseled on cessation.  Diet: Diet clear liquid Room service appropriate?: Yes; Fluid consistency:: Thin Fluids: NS DVT Prophylaxis: SCD  Code Status: Full Code Family Communication: no family bedside  Disposition Plan: home when ready     Barriers to discharge: advance diet, pain control  Consultants:  GI  Procedures:  None    Antibiotics Ciprofloxacin 1/6 >> Metronidazole 1/6 >>   Studies  Dg Abd Acute W/chest  04/15/2015  CLINICAL DATA:  Abdominal pain after colonoscopy today. EXAM: DG ABDOMEN ACUTE W/ 1V CHEST COMPARISON:  03/19/2015 FINDINGS: There is no evidence of dilated bowel loops or free intraperitoneal air. No radiopaque calculi or other significant radiographic abnormality is seen. Heart size and mediastinal contours are within normal limits. Both lungs are clear. There are multiple new clips in the right lower quadrant of the abdomen with slight soft tissue prominence around the clips as well as surgical clips in the right upper quadrant from previous cholecystectomy. IMPRESSION: New metallic clips in the right lower quadrant. Otherwise benign-appearing abdomen and pelvis. Electronically Signed   By: Lorriane Shire M.D.   On: 04/15/2015 13:46    Objective  Filed Vitals:   04/15/15 1604 04/15/15 1712 04/15/15 2110 04/16/15 0500  BP: 114/72 114/66 114/66 108/72  Pulse: 104 110 117 101  Temp: 97.8 F (36.6 C) 97.6 F (36.4 C) 100 F (37.8 C) 98.2 F (36.8 C)  TempSrc: Oral Oral Oral Oral  Resp: 17 18 18 18   Height:  5' 7.01" (1.702 m)    Weight:  95.2 kg (209 lb 14.1 oz)    SpO2: 93% 96% 92% 95%    Intake/Output Summary (Last 24 hours) at 04/16/15 1233 Last data filed at 04/16/15 1130  Gross per 24 hour  Intake 893.33 ml  Output   1400 ml  Net -506.67 ml   Filed Weights   04/15/15 1712  Weight: 95.2 kg (209 lb 14.1 oz)    Exam:  GENERAL: NAD  HEENT: no scleral icterus  LUNGS: CTA biL, no wheezing  HEART: RRR without MRG  ABDOMEN: soft, diffusely tender to palpation, no rebound tenderness  Data Reviewed: Basic Metabolic Panel:  Recent Labs Lab 04/13/15 0934 04/15/15 1304 04/16/15 0534  NA 135 140 140  K 4.7 4.1 4.6  CL 101 106 108  CO2 24 24 23   GLUCOSE 269* 164*  159*  BUN 14 15 14   CREATININE 0.72 0.72 0.88  CALCIUM 10.1 8.5* 8.8*   Liver Function Tests:  Recent Labs Lab 04/15/15 1304  AST 60*  ALT 46  ALKPHOS 77  BILITOT 0.6  PROT 6.8  ALBUMIN 3.7    Recent Labs Lab 04/15/15 1304  LIPASE 18   CBC:  Recent Labs Lab 04/13/15 0934 04/15/15 1304 04/16/15 0534  WBC 8.9 7.9 14.1*  NEUTROABS 5.7 4.0  --   HGB 14.1 13.5 13.2  HCT 42.1 40.1 41.5  MCV 98.4 99.5 103.5*  PLT 184.0 184 194   CBG:  Recent Labs Lab 04/15/15 2009 04/16/15 0045 04/16/15 0327 04/16/15 0756 04/16/15 1142  GLUCAP 159* 168* 163* 157* 154*    Scheduled Meds: . sodium chloride   Intravenous Once  . amitriptyline  25 mg Oral QHS  . ciprofloxacin  400 mg Intravenous Q12H  . insulin aspart  0-9 Units Subcutaneous 6 times per day  . lisinopril  5 mg Oral Daily  . metronidazole  500 mg Intravenous Q8H  . simvastatin  20 mg Oral QPM   Continuous Infusions: . sodium chloride 100 mL/hr (04/16/15 0319)    Marzetta Board, MD [facility] Pager [phone]. If 7 PM - 7 AM, please contact night-coverage at [email or web address], password [email or web address] 04/16/2015, 12:33 PM

## 2015-04-16 NOTE — Progress Notes (Signed)
HISTORY OF PRESENT ILLNESS:  Courtney Lindsey is a 50 y.o. female who was advanced to the hospital yesterday with post polypectomy abdominal pain. Negative plain films of the abdomen and initial blood work. Has been requiring premedication since admission. Overall, states that she is improving though still with moderate pain. The discomfort is predominantly right-sided. Tolerating liquid diet. Actually hungry. No bowel movements. Low-grade temperature overnight the patient did not feel febrile. Laboratories show elevated white blood cell count today 14+ K. No new complaints.  REVIEW OF SYSTEMS:  All non-GI ROS negative today  Past Medical History  Diagnosis Date  . Diabetes mellitus without complication ([place])   . Slow transit constipation     Past Surgical History  Procedure Laterality Date  . Cholecystectomy    . Abdominal hysterectomy    . Hernia repair    . Cesarean section    . Exposed fistula      13 years ago    Social History Courtney Lindsey  reports that she has been smoking Cigarettes.  She started smoking about 22 years ago. She has a 25 pack-year smoking history. She has never used smokeless tobacco. She reports that she drinks alcohol. She reports that she does not use illicit drugs.  family history includes Diabetes in her paternal grandmother and sister; Heart disease in her father.  No Known Allergies     PHYSICAL EXAMINATION: Vital signs: BP 117/70 mmHg  Pulse 84  Temp(Src) 98.3 F (36.8 C) (Oral)  Resp 18  Ht 5' 7.01" (1.702 m)  Wt 209 lb 14.1 oz (95.2 kg)  BMI 32.86 kg/m2  SpO2 97% General: Well-developed, well-nourished, no acute distress HEENT: Sclerae are anicteric, conjunctiva pink. Oral mucosa intact Lungs: Clear Heart: Regular Abdomen: soft, obese, moderate right mid abdominal discomfort with palpation, nondistended, no obvious ascites, very mild peritoneal signs, normal bowel sounds. No organomegaly. Extremities: No clubbing cyanosis or  edema Psychiatric: alert and oriented x3. Cooperative  ASSESSMENT:  #1. Abdominal pain post colonoscopy with polypectomy. 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 The patient is stable to improving. Antibiotics reasonable given leukocytosis and low-grade temperature. I reviewed with her the findings from colonoscopy in detail and explained post polypectomy syndrome. She was appreciative   PLAN:  #1. Soft diet #2. Continue antibiotics #3. Encouraged to ambulate #4. If she continues to improve, would anticipate discharge tomorrow  Docia Chuck. Geri [place]in[place]., M.D. [facility] Division of Gastroenterology

## 2015-04-17 DIAGNOSIS — E785 Hyperlipidemia, unspecified: Secondary | ICD-10-CM | POA: Diagnosis not present

## 2015-04-17 DIAGNOSIS — Z9889 Other specified postprocedural states: Secondary | ICD-10-CM | POA: Diagnosis not present

## 2015-04-17 DIAGNOSIS — I10 Essential (primary) hypertension: Secondary | ICD-10-CM | POA: Diagnosis not present

## 2015-04-17 DIAGNOSIS — R1031 Right lower quadrant pain: Secondary | ICD-10-CM | POA: Diagnosis not present

## 2015-04-17 LAB — GLUCOSE, CAPILLARY: GLUCOSE-CAPILLARY: 196 mg/dL — AB (ref 65–99)

## 2015-04-17 MED ORDER — HYDROCODONE-ACETAMINOPHEN 5-325 MG PO TABS: 1.0000 | ORAL_TABLET | ORAL | Status: DC | PRN

## 2015-04-17 MED ORDER — METRONIDAZOLE 500 MG PO TABS: 500.0000 mg | ORAL_TABLET | Freq: Three times a day (TID) | ORAL | Status: DC

## 2015-04-17 MED ORDER — CIPROFLOXACIN HCL 500 MG PO TABS: 500.0000 mg | ORAL_TABLET | Freq: Two times a day (BID) | ORAL | Status: DC

## 2015-04-17 NOTE — Discharge Summary (Signed)
Physician Discharge Summary  SHATEARA TIPPIE [id number] DOB: [birth year] DOA: 04/15/2015  PCP: Ruben Reason, MD  Admit date: 04/15/2015 Discharge date: 04/17/2015  Time spent: < 30 minutes  Recommendations for Outpatient Follow-up:  1. Follow up with Dr. Linna Darner as needed 2. Follow up with Dr. Carlean Purl as needed   Discharge Diagnoses:  Principal Problem:   Abdominal pain Active Problems:   Hyperlipidemia   HTN (hypertension)   Diabetic neuropathy, painful ([place])   Tobacco abuse   Status post colonoscopy with polypectomy   Right lower quadrant abdominal pain   Leukocytosis  Discharge Condition: stable  Diet recommendation: regular  Filed Weights   04/15/15 1712  Weight: 95.2 kg (209 lb 14.1 oz)    History of present illness:  See H&P, Labs, Consult and Test reports for all details in brief, patient is a 50 yo F admitted with severe abdominal pain following a colonoscopy.  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 Post polypectomy electrocoagulation syndrome is an uncommon phenomenon that occurs when electrical current applied during polypectomy extends into the muscularis and serosa resulting in contrast mural burn without perforation. It is commonly seen after removal of sessile polyps >2 cm in size with a higher amount of thermal energy applied over a long period of time. Due to irritation of the serosa it causes a local inflammatory response with symptoms similar to localized peritonitis. Patient was admitted for observation. She was initially NPO and her diet was slowly advanced. She was placed on empiric antibiotics and GI has seen her while hospitalized. Her abdominal pain gradually improved, she is able to tolerate a regular diet. She was discharged home in stable condition and is to follow up with GI as needed. She is to complete empiric antibiotics for 5 additional  days.  Type 2 diabetes mellitus with neuropathy  Essential hypertension - Resume lisinopril. Hyperlipidemia - Continue statin Tobacco abuse - Counseled on cessation.  Procedures:  None    Consultations:  GI  Discharge Exam: Filed Vitals:   04/16/15 0500 04/16/15 1346 04/16/15 2125 04/17/15 0600  BP: 108/72 117/70 124/74 118/82  Pulse: 101 84 110 88  Temp: 98.2 F (36.8 C) 98.3 F (36.8 C) 98.2 F (36.8 C) 98 F (36.7 C)  TempSrc: Oral Oral Oral Oral  Resp: 18 18 18 18   Height:      Weight:      SpO2: 95% 97% 94% 96%    General: NAD Cardiovascular: RRR Respiratory: CTA biL  Discharge Instructions Activity:  As tolerated   Get Medicines reviewed and adjusted: Please take all your medications with you for your next visit with your Primary MD  Please request your Primary MD to go over all hospital tests and procedure/radiological results at the follow up, please ask your Primary MD to get all Hospital records sent to his/her office.  If you experience worsening of your admission symptoms, develop shortness of breath, life threatening emergency, suicidal or homicidal thoughts you must seek medical attention immediately by calling 911 or calling your MD immediately if symptoms less severe.  You must read complete instructions/literature along with all the possible adverse reactions/side effects for all the Medicines you take and that have been prescribed to you. Take any new Medicines after you have completely understood and accpet all the possible adverse reactions/side effects.   Do not drive when taking Pain medications.   Do not take more than prescribed Pain,  Sleep and Anxiety Medications  Special Instructions: If you have smoked or chewed Tobacco in the last 2 yrs please stop smoking, stop any regular Alcohol and or any Recreational drug use.  Wear Seat belts while driving.  Please note  You were cared for by a hospitalist during your hospital stay. Once  you are discharged, your primary care physician will handle any further medical issues. Please note that NO REFILLS for any discharge medications will be authorized once you are discharged, as it is imperative that you return to your primary care physician (or establish a relationship with a primary care physician if you do not have one) for your aftercare needs so that they can reassess your need for medications and monitor your lab values.    Medication List    TAKE these medications        acetaminophen 500 MG tablet  Commonly known as:  TYLENOL  Take 500 mg by mouth every 6 (six) hours as needed (Pain).     amitriptyline 25 MG tablet  Commonly known as:  ELAVIL  Take 1 tablet (25 mg total) by mouth at bedtime.     ciprofloxacin 500 MG tablet  Commonly known as:  CIPRO  Take 1 tablet (500 mg total) by mouth 2 (two) times daily.     diclofenac sodium 1 % Gel  Commonly known as:  VOLTAREN  Apply 2 g topically 4 (four) times daily.     glipiZIDE 5 MG tablet  Commonly known as:  GLUCOTROL  Take 1 tablet (5 mg total) by mouth 2 (two) times daily before a meal.     HYDROcodone-acetaminophen 5-325 MG tablet  Commonly known as:  NORCO/VICODIN  Take 1-2 tablets by mouth every 4 (four) hours as needed for moderate pain or severe pain.     lisinopril 5 MG tablet  Commonly known as:  PRINIVIL,ZESTRIL  Take one daily for blood pressure and diabetes     metFORMIN 1000 MG tablet  Commonly known as:  GLUCOPHAGE  Take 1 tablet (1,000 mg total) by mouth 2 (two) times daily with a meal.     metroNIDAZOLE 500 MG tablet  Commonly known as:  FLAGYL  Take 1 tablet (500 mg total) by mouth 3 (three) times daily.     simvastatin 20 MG tablet  Commonly known as:  ZOCOR  Take 1 tablet (20 mg total) by mouth every evening.     traMADol 50 MG tablet  Commonly known as:  ULTRAM  Take 50 mg by mouth every 6 (six) hours as needed (Pain).           Follow-up Information    Follow up with  HOPPER,DAVID, MD.   Specialty:  Family Medicine   Why:  As needed   Contact information:   [place] [id number] [phone]       Follow up with Silvano Rusk, MD.   Specialty:  Gastroenterology   Why:  As needed   Contact information:   [address] [place] [address] [phone]       The results of significant diagnostics from this hospitalization (including imaging, microbiology, ancillary and laboratory) are listed below for reference.    Significant Diagnostic Studies: Dg Lumbar Spine 2-3 Views  03/19/2015  CLINICAL DATA:  Lumbago EXAM: LUMBAR SPINE - 2-3 VIEW COMPARISON:  None. FINDINGS: Frontal, lateral, and spot lumbosacral lateral images were obtained. The there are 5 non-rib-bearing lumbar type vertebral bodies. There is no fracture or spondylolisthesis.  The disc spaces appear intact. There are small anterior osteophytes at all levels. No erosive change. IMPRESSION: No fracture or spondylolisthesis. Small anterior osteophytes at all levels. No appreciable disc space narrowing. No erosive change. Electronically Signed   By: Lowella Grip III M.D.   On: 03/19/2015 13:19   Dg Knee 1-2 Views Right  03/19/2015  CLINICAL DATA:  Pain EXAM: RIGHT KNEE - 1-2 VIEW COMPARISON:  None. FINDINGS: Frontal and lateral views were obtained. There is no fracture or dislocation. No joint effusion. Joint spaces appear intact. No erosive change. IMPRESSION: No fracture or joint effusion.  No appreciable arthropathic change. Electronically Signed   By: Lowella Grip III M.D.   On: 03/19/2015 13:20   Dg Abd 1 View  03/19/2015  CLINICAL DATA:  Uncontrolled type 2 diabetes EXAM: ABDOMEN - 1 VIEW COMPARISON:  None. FINDINGS: The bowel gas pattern is normal. No radio-opaque calculi or other significant radiographic abnormality are seen. Postcholecystectomy surgical clips are noted. Some colonic stool noted in right colon and transverse colon. Left pelvic phleboliths  are noted. IMPRESSION: Negative. Postcholecystectomy surgical clips. Some colonic stool in right colon and transverse colon. Electronically Signed   By: Lahoma Crocker M.D.   On: 03/19/2015 13:20   Dg Abd Acute W/chest  04/15/2015  CLINICAL DATA:  Abdominal pain after colonoscopy today. EXAM: DG ABDOMEN ACUTE W/ 1V CHEST COMPARISON:  03/19/2015 FINDINGS: There is no evidence of dilated bowel loops or free intraperitoneal air. No radiopaque calculi or other significant radiographic abnormality is seen. Heart size and mediastinal contours are within normal limits. Both lungs are clear. There are multiple new clips in the right lower quadrant of the abdomen with slight soft tissue prominence around the clips as well as surgical clips in the right upper quadrant from previous cholecystectomy. IMPRESSION: New metallic clips in the right lower quadrant. Otherwise benign-appearing abdomen and pelvis. Electronically Signed   By: Lorriane Shire M.D.   On: 04/15/2015 13:46    Microbiology: No results found for this or any previous visit (from the past 240 hour(s)).   Labs: Basic Metabolic Panel:  Recent Labs Lab 04/13/15 0934 04/15/15 1304 04/16/15 0534  NA 135 140 140  K 4.7 4.1 4.6  CL 101 106 108  CO2 24 24 23   GLUCOSE 269* 164* 159*  BUN 14 15 14   CREATININE 0.72 0.72 0.88  CALCIUM 10.1 8.5* 8.8*   Liver Function Tests:  Recent Labs Lab 04/15/15 1304  AST 60*  ALT 46  ALKPHOS 77  BILITOT 0.6  PROT 6.8  ALBUMIN 3.7    Recent Labs Lab 04/15/15 1304  LIPASE 18   CBC:  Recent Labs Lab 04/13/15 0934 04/15/15 1304 04/16/15 0534  WBC 8.9 7.9 14.1*  NEUTROABS 5.7 4.0  --   HGB 14.1 13.5 13.2  HCT 42.1 40.1 41.5  MCV 98.4 99.5 103.5*  PLT 184.0 184 194   CBG:  Recent Labs Lab 04/16/15 1142 04/16/15 1557 04/16/15 1705 04/16/15 2140 04/17/15 0741  GLUCAP 154* 160* 244* 207* 196*       Signed:  GHERGHE, COSTIN  [facility] 04/17/2015, 12:36 PM

## 2015-04-17 NOTE — Discharge Instructions (Signed)
Follow with HOPPER,DAVID, MD in 5-7 days  Please get a complete blood count and chemistry panel checked by your Primary MD at your next visit, and again as instructed by your Primary MD. Please get your medications reviewed and adjusted by your Primary MD.  Please request your Primary MD to go over all Hospital Tests and Procedure/Radiological results at the follow up, please get all Hospital records sent to your Prim MD by signing hospital release before you go home.  If you had Pneumonia of Lung problems at the Hospital: Please get a 2 view Chest X ray done in 6-8 weeks after hospital discharge or sooner if instructed by your Primary MD.  If you have Congestive Heart Failure: Please call your Cardiologist or Primary MD anytime you have any of the following symptoms:  1) 3 pound weight gain in 24 hours or 5 pounds in 1 week  2) shortness of breath, with or without a dry hacking cough  3) swelling in the hands, feet or stomach  4) if you have to sleep on extra pillows at night in order to breathe  Follow cardiac low salt diet and 1.5 lit/day fluid restriction.  If you have diabetes Accuchecks 4 times/day, Once in AM empty stomach and then before each meal. Log in all results and show them to your primary doctor at your next visit. If any glucose reading is under 80 or above 300 call your primary MD immediately.  If you have Seizure/Convulsions/Epilepsy: Please do not drive, operate heavy machinery, participate in activities at heights or participate in high speed sports until you have seen by Primary MD or a Neurologist and advised to do so again.  If you had Gastrointestinal Bleeding: Please ask your Primary MD to check a complete blood count within one week of discharge or at your next visit. Your endoscopic/colonoscopic biopsies that are pending at the time of discharge, will also need to followed by your Primary MD.  Get Medicines reviewed and adjusted. Please take all your  medications with you for your next visit with your Primary MD  Please request your Primary MD to go over all hospital tests and procedure/radiological results at the follow up, please ask your Primary MD to get all Hospital records sent to his/her office.  If you experience worsening of your admission symptoms, develop shortness of breath, life threatening emergency, suicidal or homicidal thoughts you must seek medical attention immediately by calling 911 or calling your MD immediately  if symptoms less severe.  You must read complete instructions/literature along with all the possible adverse reactions/side effects for all the Medicines you take and that have been prescribed to you. Take any new Medicines after you have completely understood and accpet all the possible adverse reactions/side effects.   Do not drive or operate heavy machinery when taking Pain medications.   Do not take more than prescribed Pain, Sleep and Anxiety Medications  Special Instructions: If you have smoked or chewed Tobacco  in the last 2 yrs please stop smoking, stop any regular Alcohol  and or any Recreational drug use.  Wear Seat belts while driving.  Please note You were cared for by a hospitalist during your hospital stay. If you have any questions about your discharge medications or the care you received while you were in the hospital after you are discharged, you can call the unit and asked to speak with the hospitalist on call if the hospitalist that took care of you is not available. Once you  are discharged, your primary care physician will handle any further medical issues. Please note that NO REFILLS for any discharge medications will be authorized once you are discharged, as it is imperative that you return to your primary care physician (or establish a relationship with a primary care physician if you do not have one) for your aftercare needs so that they can reassess your need for medications and monitor your  lab values.  You can reach the hospitalist office at phone [phone] or fax [phone]   If you do not have a primary care physician, you can call [phone] for a physician referral.  Activity: As tolerated with Full fall precautions use walker/cane & assistance as needed  Diet: regular  Disposition Home

## 2015-04-17 NOTE — Progress Notes (Signed)
Assessment unchanged. Pt verbalized understanding of dc instructions through teach back regarding follow up care and when to call the doctor. Scripts x 3 given as provided by MD. Discharged via wc to front entrance to meet daughter and vehicle to carry home. Accompanied by NT.

## 2015-04-18 ENCOUNTER — Telehealth: Payer: Self-pay

## 2015-04-18 NOTE — Telephone Encounter (Signed)
Patient discharged from hospital and wants Dr. Linna Darner to look at the notes and advise her on what to do. Patient states that there's a lot going that she doesn't understand.

## 2015-04-18 NOTE — Telephone Encounter (Signed)
Advised pt, message from Dr. Linna Darner.

## 2015-04-18 NOTE — Telephone Encounter (Signed)
I have not seen her in a long time, and looking at the hospital note I think I probably should see her to try and help make any recommendations. I don't have any other major suggestions until then.

## 2015-04-19 ENCOUNTER — Telehealth: Payer: Self-pay | Admitting: *Deleted

## 2015-04-19 ENCOUNTER — Telehealth: Payer: Self-pay

## 2015-04-19 ENCOUNTER — Ambulatory Visit: Payer: 59 | Admitting: Internal Medicine

## 2015-04-19 NOTE — Telephone Encounter (Signed)
I put her on the schedule

## 2015-04-19 NOTE — Telephone Encounter (Signed)
I could not tell who an appointment was set up with.  If she wishes to see me I am only walk-in, and will be there this evening after 3.

## 2015-04-19 NOTE — Telephone Encounter (Signed)
I spoke to her - she is better but still having pain  She got an appointment w/ PCP but I would like her to see me this PM ? 415 or squeeze in another slot   She can then cancel that appt - other option is to see APP and I will check her also

## 2015-04-19 NOTE — Telephone Encounter (Signed)
Sheri, I don't think I can open up a 4:15.  Would you schedule this please?  Thanks, [employer]

## 2015-04-19 NOTE — Telephone Encounter (Signed)
  Follow up Call-  Call back number 04/15/2015  Post procedure Call Back phone  # [phone]  Permission to leave phone message Yes    Pt wasn't at work so called home number Patient questions:  Do you have a fever, pain , or abdominal swelling? Yes.   Pain Score  wouldn't rate but said it was "better" *  Have you tolerated food without any problems? Yes.    Have you been able to return to your normal activities? No.  Do you have any questions about your discharge instructions: Diet   No. Medications  No. Follow up visit  No.  Do you have questions or concerns about your Care? No.  Actions: * If pain score is 4 or above: Physician/ provider Notified : Silvano Rusk, MD   Dr. Carlean Purl, This pt is still having pain- better now but wouldn't rate for me.  She is taking Vicodin 1 tablet po every 6 hours.  Still having nausea with eating.  She is very concerned about what is going on and work.  She has an appointment with her family doctor at 3:00 p.m.  She is asking that you please call her.

## 2015-04-19 NOTE — Telephone Encounter (Signed)
  Spoke with patient and she was unaware that she had appointment today so per Dr Carlean Purl we will see her 04/21/2015 at 9:30AM and we will fax a work note to Att:  Mosie Lukes at fax # [phone] to cover her missing work this week.

## 2015-04-19 NOTE — Telephone Encounter (Signed)
PT states she missed appointment with the GI specialist... Due to the missed appointment with GI she would like to postpone her [facility] follow-up until next walk-in (Dr. Linna Darner 04/28/15 3pm-8:30pm)...  Thank you,

## 2015-04-21 ENCOUNTER — Encounter: Payer: Self-pay | Admitting: Internal Medicine

## 2015-04-21 ENCOUNTER — Ambulatory Visit (INDEPENDENT_AMBULATORY_CARE_PROVIDER_SITE_OTHER): Payer: Self-pay | Admitting: Internal Medicine

## 2015-04-21 VITALS — BP 130/84 | HR 80 | Ht 67.32 in | Wt 218.0 lb

## 2015-04-21 DIAGNOSIS — K5731 Diverticulosis of large intestine without perforation or abscess with bleeding: Secondary | ICD-10-CM

## 2015-04-21 DIAGNOSIS — Z86010 Personal history of colonic polyps: Secondary | ICD-10-CM | POA: Insufficient documentation

## 2015-04-21 DIAGNOSIS — K5900 Constipation, unspecified: Secondary | ICD-10-CM

## 2015-04-21 DIAGNOSIS — R10813 Right lower quadrant abdominal tenderness: Secondary | ICD-10-CM

## 2015-04-21 DIAGNOSIS — R1031 Right lower quadrant pain: Secondary | ICD-10-CM

## 2015-04-21 HISTORY — DX: Personal history of colonic polyps: Z86.010

## 2015-04-21 MED ORDER — TRAMADOL HCL 50 MG PO TABS: 50.0000 mg | ORAL_TABLET | Freq: Four times a day (QID) | ORAL | Status: DC | PRN

## 2015-04-21 MED ORDER — DICYCLOMINE HCL 20 MG PO TABS: 20.0000 mg | ORAL_TABLET | Freq: Three times a day (TID) | ORAL | Status: DC

## 2015-04-21 NOTE — Patient Instructions (Addendum)
You have been given a separate informational sheet regarding your tobacco use, the importance of quitting and local resources to help you quit.   We have sent the following medications to your pharmacy for you to pick up at your convenience: Dicyclomine and Tramadol (faxed to pharmacy)   Take a dose of miralax daily.   Call us Monday with an update , you may leave message for Barb Merino, RN, [facility].   I appreciate the opportunity to care for you. Silvano Rusk, MD, [facility]

## 2015-04-21 NOTE — Progress Notes (Signed)
   Subjective:    Patient ID: Courtney Lindsey, female    DOB: 18-Sep-1965, 50 y.o.   MRN: [id number] Chief Complaint:  abdominal pain after colonoscopy HPI  Reason returns. She was hospitalized for 2-3 days after her colonoscopy, she had acute onset of right lower quadrant pain knifelike after I removed large polyps in the right colon. 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 She has been treated with IV antibiotics and then oral antibiotics and narcotics. She is improving but has persistent abdominal pain. Bowel movements are starting to return though small. No fever. She had to stop hydrocodone because of nausea. She has been given a work note for this week.  She is describing 3 types of pain. One is right infrascapular pain there is right upper quadrant pain. She had both of these prior to the colonoscopy and they seemed to improve when she defecated. JVDs to constipation. She has a right lower quadrant pain and tenderness that started after the colonoscopy. Again it is better but still there.  Medications, allergies, past medical history, past surgical history, family history and social history are reviewed and updated in the EMR.   Review of Systems As above    Objective:   Physical Exam BP 130/84 mmHg  Pulse 80  Ht 5' 7.32" (1.71 m)  Wt 218 lb (98.884 kg)  BMI 33.82 kg/m2 NAD Mild R CVAT Tender RUQ/ - mild, RLQ mild-mod no peritoneal signs       Assessment & Plan:  RLQ abdominal tenderness - due to post polypectomy burn syndrome  RLQ abdominal pain  Constipation, unspecified constipation type  History of colonic polyps  Diverticulosis of colon with hemorrhage   Working diagnosis is still post polypectomy burn syndrome after large colonoscopy polypectomy. She is improved. I wonder if some of her symptoms and she's having now didn't predate the colonoscopy and they did. I can't tell how much of that is playing in here. I think she may need a CT scan though she  does seem to be improving and she and I decided to hold off on that but she is not significantly better by early next week I will do that. I told her to call back on Monday of next week with an update, I could authorize more time out of work if necessary. Her antibiotics. I will prescribe tramadol for pain she is taken that in the past with success. I will prescribe MiraLAX for constipation. Dicyclomine for pain also to see if that will help.  Note that polyps were sessile serrated polyps, there were large, she needs a repeat colonoscopy in 3-6 months to ensure follow-up. I reviewed that with her today.

## 2015-04-21 NOTE — Progress Notes (Signed)
Quick Note:  Reviewed w/ patient in office 3 month colon recall entered from office ______

## 2015-04-23 NOTE — Telephone Encounter (Signed)
That will be fine. 

## 2015-04-25 ENCOUNTER — Telehealth: Payer: Self-pay | Admitting: Internal Medicine

## 2015-04-25 DIAGNOSIS — R109 Unspecified abdominal pain: Secondary | ICD-10-CM

## 2015-04-25 NOTE — Telephone Encounter (Signed)
Patient notified of recommendations She is scheduled for tomorrow at 3:30 at [facility] She will come pick up contrast and instructions tomorrow by 1:30.  She is notified to hold her metformin tomorrow.

## 2015-04-25 NOTE — Telephone Encounter (Signed)
Needs to do CT abd/pelvis with contrast re: persistent abdominal pain right side after colonoscoopy

## 2015-04-25 NOTE — Telephone Encounter (Signed)
Left message for patient to call back  

## 2015-04-25 NOTE — Telephone Encounter (Signed)
Patient reports BM today but her pain has not improved.  "dull ache in lower abdomen".  Having pain in lower back mostly on the right side, also having pain under right breast.  She has tolerated a diet today, but had vomiting x 2 yesterday.

## 2015-04-26 ENCOUNTER — Ambulatory Visit (INDEPENDENT_AMBULATORY_CARE_PROVIDER_SITE_OTHER)
Admission: RE | Admit: 2015-04-26 | Discharge: 2015-04-26 | Disposition: A | Discharge status: Home / Self Care | Payer: 59 | Source: Ambulatory Visit | Attending: Internal Medicine | Admitting: Internal Medicine

## 2015-04-26 DIAGNOSIS — R109 Unspecified abdominal pain: Secondary | ICD-10-CM | POA: Diagnosis not present

## 2015-04-26 MED ORDER — IOHEXOL 300 MG/ML  SOLN: 100.0000 mL | Freq: Once | INTRAMUSCULAR | Status: DC | PRN

## 2015-04-27 NOTE — Progress Notes (Signed)
Quick Note:  CT scan ok  I am thinking her abd pain now may be what she had prior to colonoscopy  Try dicyclomine 20 mg qac and HS  Schedule a f/u 1 month approx ______

## 2015-05-02 ENCOUNTER — Telehealth: Payer: Self-pay | Admitting: Internal Medicine

## 2015-05-02 NOTE — Telephone Encounter (Signed)
Patient notified that I was sorry, but that this goes through a company called [employer] and they are the ones that require the payment.  I provided her the address and phone number to mail them the money to complete forms

## 2015-05-25 ENCOUNTER — Ambulatory Visit: Payer: Self-pay | Admitting: Internal Medicine

## 2015-06-07 ENCOUNTER — Ambulatory Visit (INDEPENDENT_AMBULATORY_CARE_PROVIDER_SITE_OTHER): Payer: 59 | Admitting: Internal Medicine

## 2015-06-07 ENCOUNTER — Encounter: Payer: Self-pay | Admitting: Internal Medicine

## 2015-06-07 VITALS — BP 102/70 | HR 72 | Ht 67.0 in | Wt 215.4 lb

## 2015-06-07 DIAGNOSIS — K635 Polyp of colon: Secondary | ICD-10-CM | POA: Diagnosis not present

## 2015-06-07 NOTE — Patient Instructions (Signed)
We will contact you when the May schedule comes out to get you a pre-visit and colonoscopy appointment set up.     I appreciate the opportunity to care for you. Silvano Rusk, MD, [facility]

## 2015-06-07 NOTE — Progress Notes (Signed)
   Subjective:    Patient ID: Courtney Lindsey, female    DOB: 27-Dec-1965, 50 y.o.   MRN: [id number] Cc: RLQ pain, colon polyp HPI LQ pain much better - had a post-polypectomy burn syndrome after removing large right colon polyp in January. Had to be hospitalized, got some large bills, including ambulance trip.  Has had URI and cough - with some new RLQ or groin pain with the cough only.  Medications, allergies, past medical history, past surgical history, family history and social history are reviewed and updated in the EMR.'   Review of Systems As above    Objective:   Physical Exam BP 102/70 mmHg  Pulse 72  Ht 5\' 7"  (1.702 m)  Wt 215 lb 6 oz (97.693 kg)  BMI 33.72 kg/m2    Assessment & Plan:  Colon polyp  RLQ pain seems much better - at least pain after polypectomy bun. She needs a repeat close f/u colonoscopy to ensure removal of the polyp - plan to do on a Fri in May. Works at [employer] and it gets busy in summer.  Marland Kitchen

## 2015-08-16 ENCOUNTER — Ambulatory Visit (AMBULATORY_SURGERY_CENTER): Payer: Self-pay

## 2015-08-16 VITALS — Ht 67.0 in | Wt 217.8 lb

## 2015-08-16 DIAGNOSIS — Z860100 Personal history of colon polyps, unspecified: Secondary | ICD-10-CM

## 2015-08-16 MED ORDER — SUPREP BOWEL PREP KIT 17.5-3.13-1.6 GM/177ML PO SOLN: 1.0000 | Freq: Once | ORAL | Status: DC

## 2015-08-19 ENCOUNTER — Encounter: Payer: Self-pay | Admitting: Internal Medicine

## 2015-09-02 ENCOUNTER — Encounter: Payer: Self-pay | Admitting: Internal Medicine

## 2015-09-02 ENCOUNTER — Other Ambulatory Visit: Payer: Self-pay | Admitting: Internal Medicine

## 2015-09-02 ENCOUNTER — Ambulatory Visit (AMBULATORY_SURGERY_CENTER): Payer: 59 | Admitting: Internal Medicine

## 2015-09-02 VITALS — BP 109/70 | HR 86 | Temp 98.4°F | Resp 20 | Ht 67.0 in | Wt 217.0 lb

## 2015-09-02 DIAGNOSIS — D123 Benign neoplasm of transverse colon: Secondary | ICD-10-CM | POA: Diagnosis not present

## 2015-09-02 DIAGNOSIS — Z860100 Personal history of colon polyps, unspecified: Secondary | ICD-10-CM

## 2015-09-02 DIAGNOSIS — D120 Benign neoplasm of cecum: Secondary | ICD-10-CM | POA: Diagnosis not present

## 2015-09-02 MED ORDER — SODIUM CHLORIDE 0.9 % IV SOLN: 500.0000 mL | INTRAVENOUS | Status: DC

## 2015-09-02 NOTE — Progress Notes (Signed)
Report to PACU, RN, vss, BBS= Clear.  

## 2015-09-02 NOTE — Patient Instructions (Signed)

## 2015-09-02 NOTE — Progress Notes (Signed)
Called to room to assist during endoscopic procedure.  Patient ID and intended procedure confirmed with present staff. Received instructions for my participation in the procedure from the performing physician.  

## 2015-09-02 NOTE — Progress Notes (Signed)
Pt. CBG=329,denies s/s stated that she feels all right,she has been sucking on candy and drinking mountain dew. Pt. stated that she does not check CBG at home she does not have a checker,will inform doctor of information,no new orders given for elevated CBG.

## 2015-09-02 NOTE — Op Note (Signed)
[facility] Patient Name: Courtney Lindsey Procedure Date: 09/02/2015 2:01 PM MRN: [id number] Endoscopist: Gatha Mayer , MD Age: 50 Referring MD:  Date of Birth: June 19, 1965 Gender: Female Procedure:                Colonoscopy Indications:              Excision of colonic polyp Medicines:                Propofol per Anesthesia, Monitored Anesthesia Care Procedure:                Pre-Anesthesia Assessment:                           - Prior to the procedure, a History and Physical                            was performed, and patient medications and                            allergies were reviewed. The patient's tolerance of                            previous anesthesia was also reviewed. The risks                            and benefits of the procedure and the sedation                            options and risks were discussed with the patient.                            All questions were answered, and informed consent                            was obtained. Prior Anticoagulants: The patient has                            taken no previous anticoagulant or antiplatelet                            agents. ASA Grade Assessment: III - A patient with                            severe systemic disease. After reviewing the risks                            and benefits, the patient was deemed in                            satisfactory condition to undergo the procedure.                           After obtaining informed consent, the colonoscope  was passed under direct vision. Throughout the                            procedure, the patient's blood pressure, pulse, and                            oxygen saturations were monitored continuously. The                            Model CF-HQ190L [id number]) scope was introduced                            through the anus and advanced to the the cecum,                            identified by appendiceal  orifice and ileocecal                            valve. The colonoscopy was performed without                            difficulty. The patient tolerated the procedure                            well. The quality of the bowel preparation was                            excellent. The bowel preparation used was Miralax.                            The terminal ileum, ileocecal valve, appendiceal                            orifice, and rectum were photographed. Scope In: 2:10:53 PM Scope Out: 2:31:19 PM Scope Withdrawal Time: 0 hours 16 minutes 49 seconds  Total Procedure Duration: 0 hours 20 minutes 26 seconds  Findings:                 The perianal and digital rectal examinations were                            normal.                           A 10 mm polyp was found in the cecum. The polyp was                            sessile. The polyp was removed with a cold snare.                            Resection and retrieval were complete. Verification                            of patient identification for the specimen was  done. Estimated blood loss was minimal.                           A tattoo was seen in the cecum. A post-polypectomy                            scar was found at the tattoo site. Biopsies were                            taken with a cold forceps for histology.                            Verification of patient identification for the                            specimen was done. Estimated blood loss was minimal.                           A tattoo was seen in the transverse colon. A                            post-polypectomy scar was found at the tattoo site.                            Biopsies were taken with a cold forceps for                            histology. Verification of patient identification                            for the specimen was done. Estimated blood loss was                            minimal.                            Many large-mouthed diverticula were found in the                            sigmoid colon. There was no evidence of                            diverticular bleeding.                           The exam was otherwise without abnormality on                            direct and retroflexion views. Complications:            No immediate complications. Estimated Blood Loss:     Estimated blood loss was minimal. Impression:               - One 10 mm polyp in the cecum, removed with a cold  snare. Resected and retrieved.                           - A tattoo was seen in the cecum. A                            post-polypectomy scar was found at the tattoo site.                            Biopsied.                           - A tattoo was seen in the transverse colon. A                            post-polypectomy scar was found at the tattoo site.                            Biopsied.                           - Severe diverticulosis in the sigmoid colon. There                            was no evidence of diverticular bleeding.                           - The examination was otherwise normal on direct                            and retroflexion views.                           - Personal history of colonic polyps. Recommendation:           - Patient has a contact number available for                            emergencies. The signs and symptoms of potential                            delayed complications were discussed with the                            patient. Return to normal activities tomorrow.                            Written discharge instructions were provided to the                            patient.                           - Resume previous diet.                           -  Continue present medications.                           - Await pathology results.                           - Repeat colonoscopy is recommended. The                             colonoscopy date will be determined after pathology                            results from today's exam become available for                            review. Gatha Mayer, MD 09/02/2015 2:42:51 PM This report has been signed electronically.

## 2015-09-06 ENCOUNTER — Telehealth: Payer: Self-pay

## 2015-09-06 NOTE — Telephone Encounter (Signed)
Tried to reach pt at her work number, but her work did not open until 8:00 am.  Will call back. [email or web address]

## 2015-09-06 NOTE — Telephone Encounter (Signed)
  Follow up Call-  Call back number 09/02/2015 04/15/2015  Post procedure Call Back phone  # [phone]  Permission to leave phone message Yes Yes     Patient questions:  Do you have a fever, pain , or abdominal swelling? No. Pain Score  0 *  Have you tolerated food without any problems? Yes.    Have you been able to return to your normal activities? Yes.    Do you have any questions about your discharge instructions: Diet   No. Medications  No. Follow up visit  No.  Do you have questions or concerns about your Care? No.  Actions: * If pain score is 4 or above: No action needed, pain <4.

## 2015-09-14 ENCOUNTER — Encounter: Payer: Self-pay | Admitting: Internal Medicine

## 2015-09-14 DIAGNOSIS — Z86010 Personal history of colonic polyps: Secondary | ICD-10-CM

## 2015-09-14 NOTE — Progress Notes (Signed)
Quick Note:  Adenoma + 2 residual ssp's Recall colon 3 months Will need Hospital and APC ______

## 2015-12-19 ENCOUNTER — Encounter: Payer: Self-pay | Admitting: Internal Medicine

## 2016-02-22 ENCOUNTER — Other Ambulatory Visit: Payer: Self-pay | Admitting: Family Medicine

## 2016-02-22 DIAGNOSIS — E785 Hyperlipidemia, unspecified: Secondary | ICD-10-CM

## 2016-02-22 DIAGNOSIS — E1142 Type 2 diabetes mellitus with diabetic polyneuropathy: Secondary | ICD-10-CM

## 2016-02-27 ENCOUNTER — Other Ambulatory Visit: Payer: Self-pay | Admitting: Internal Medicine

## 2016-02-27 DIAGNOSIS — Z1231 Encounter for screening mammogram for malignant neoplasm of breast: Secondary | ICD-10-CM

## 2016-03-30 ENCOUNTER — Other Ambulatory Visit: Payer: Self-pay | Admitting: Urgent Care

## 2016-03-30 ENCOUNTER — Other Ambulatory Visit: Payer: Self-pay | Admitting: Family Medicine

## 2016-03-30 DIAGNOSIS — E1142 Type 2 diabetes mellitus with diabetic polyneuropathy: Secondary | ICD-10-CM

## 2016-03-30 DIAGNOSIS — E785 Hyperlipidemia, unspecified: Secondary | ICD-10-CM

## 2016-04-11 ENCOUNTER — Other Ambulatory Visit: Payer: Self-pay | Admitting: Family Medicine

## 2016-04-11 DIAGNOSIS — E1142 Type 2 diabetes mellitus with diabetic polyneuropathy: Secondary | ICD-10-CM

## 2016-04-12 NOTE — Telephone Encounter (Signed)
Unable to reach patient at numbers   Going to deny medication in hopes she calls, patient needs a follow up appointment for metformin.   Can call 1 month in once we know follow up plan

## 2016-04-15 ENCOUNTER — Other Ambulatory Visit: Payer: Self-pay | Admitting: Urgent Care

## 2016-04-15 DIAGNOSIS — E1142 Type 2 diabetes mellitus with diabetic polyneuropathy: Secondary | ICD-10-CM

## 2016-04-15 DIAGNOSIS — E785 Hyperlipidemia, unspecified: Secondary | ICD-10-CM

## 2016-07-02 ENCOUNTER — Other Ambulatory Visit: Payer: Self-pay | Admitting: Physician Assistant

## 2016-07-02 DIAGNOSIS — E1142 Type 2 diabetes mellitus with diabetic polyneuropathy: Secondary | ICD-10-CM

## 2016-07-04 NOTE — Telephone Encounter (Signed)
Denied - pt has not been seen in our office since 12/16 and she has not had lab work in the system since 1/17.  She needs an OV.

## 2016-07-08 ENCOUNTER — Other Ambulatory Visit: Payer: Self-pay | Admitting: Physician Assistant

## 2016-07-08 DIAGNOSIS — E1142 Type 2 diabetes mellitus with diabetic polyneuropathy: Secondary | ICD-10-CM

## 2017-04-27 IMAGING — CR DG KNEE 1-2V*R*
2 series · 2 of 2 positions shown · non-contrast
Comparison: None.

CLINICAL DATA: Pain

EXAM:
RIGHT KNEE - 1-2 VIEW

[AP]
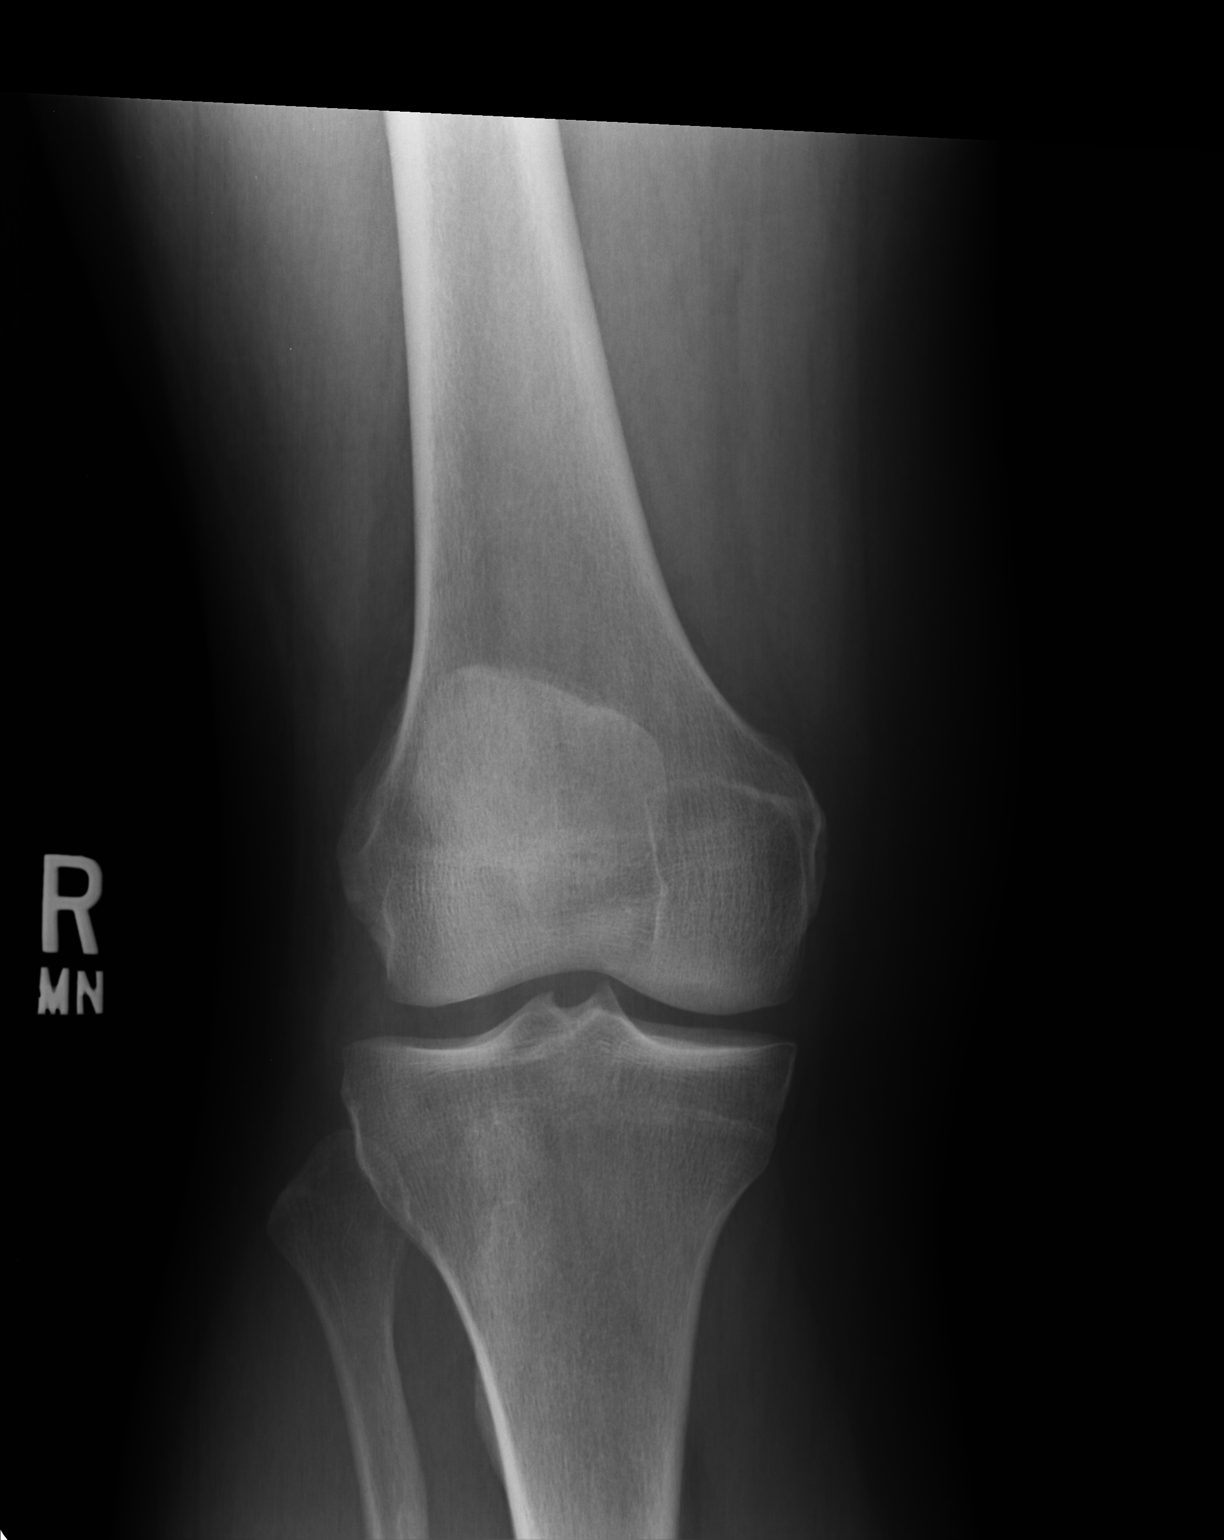

[lateral]
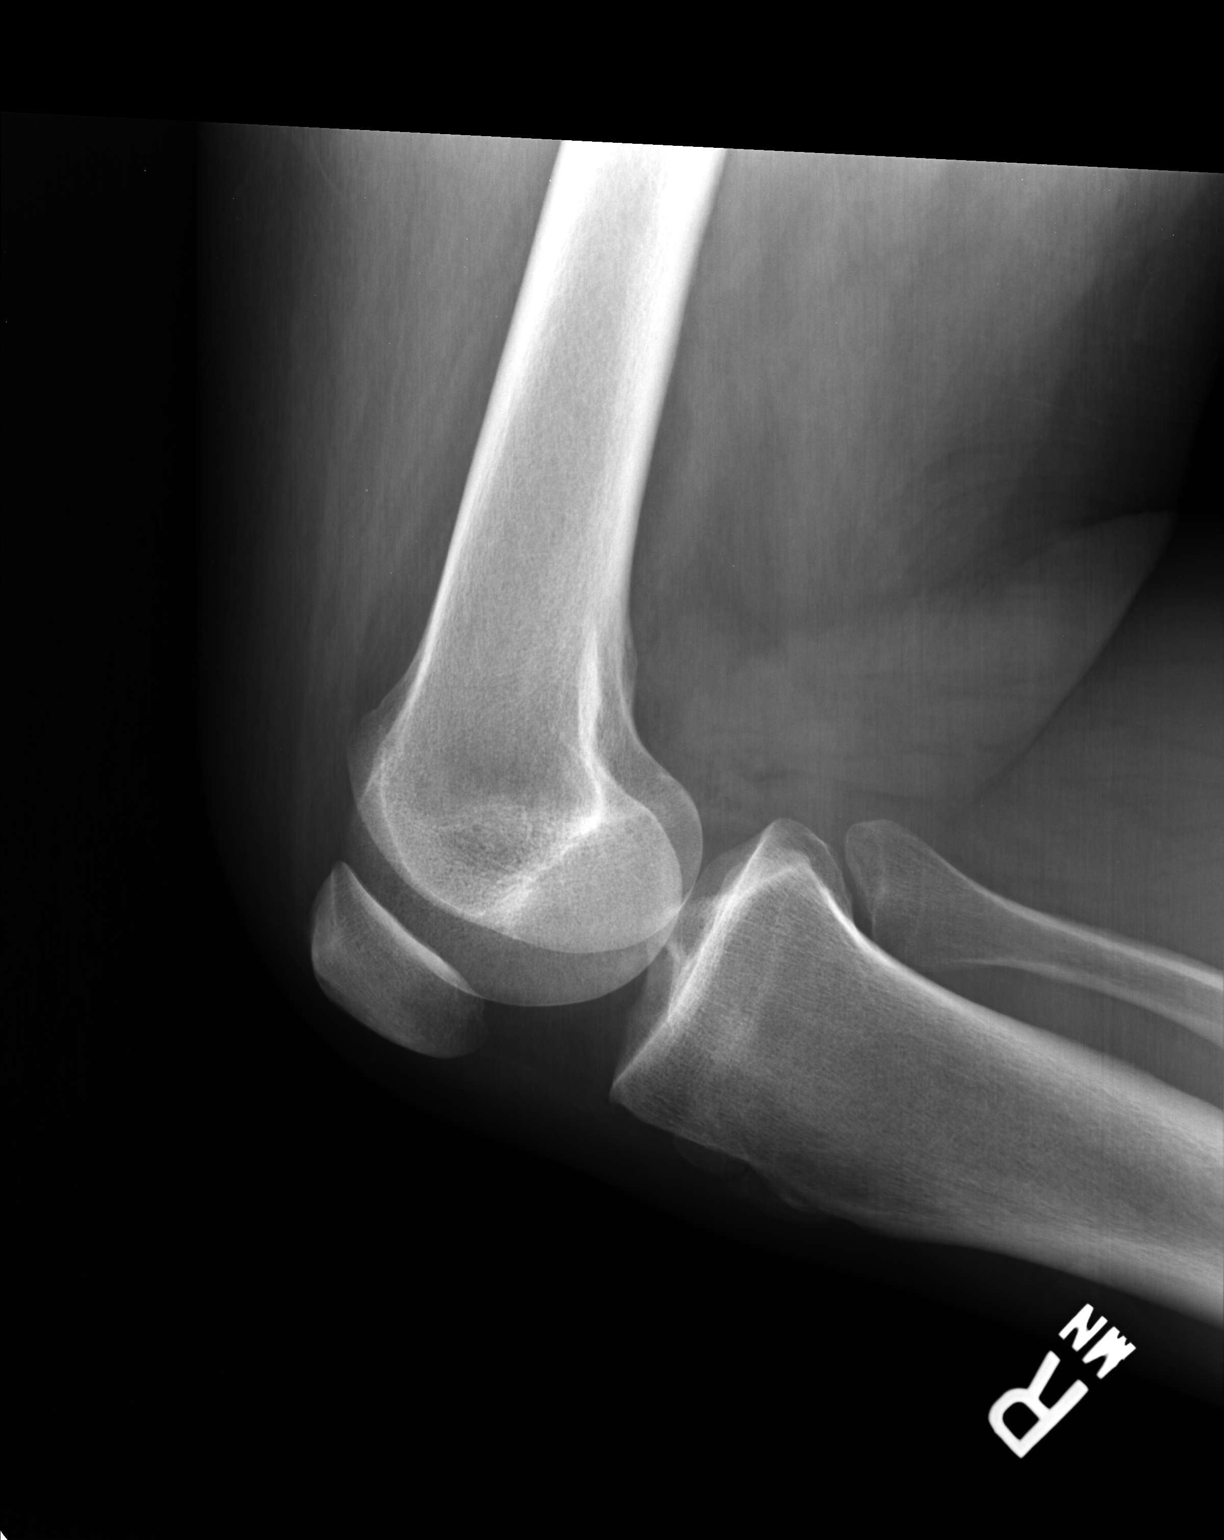

[2 of 2 positions shown; findings below may reference images not displayed]

FINDINGS: Frontal and lateral views were obtained. There is no fracture or
dislocation. No joint effusion. Joint spaces appear intact. No
erosive change.
IMPRESSION: No fracture or joint effusion.  No appreciable arthropathic change.

## 2017-06-04 IMAGING — CT CT ABD-PELV W/ CM
2 of 5 series · 16 of 46 positions shown, 18 images · IV contrast (OMNIPAQUE 300)
Comparison: None.

CLINICAL DATA: Right upper quadrant pain, constipation, new right
lower quadrant tenderness since polypectomy during recent
colonoscopy

EXAM:
CT ABDOMEN AND PELVIS WITH CONTRAST
TECHNIQUE: Multidetector CT imaging of the abdomen and pelvis was performed
using the standard protocol following bolus administration of
intravenous contrast.
CONTRAST:  100 mL Omnipaque 300 IV

[Series 2: abd/ pelvis · axial · 0.84mm/px · z∈[+829,+1264]mm · 13 of 99 slices shown, 15 images]
[im 6/99  soft-tissue]
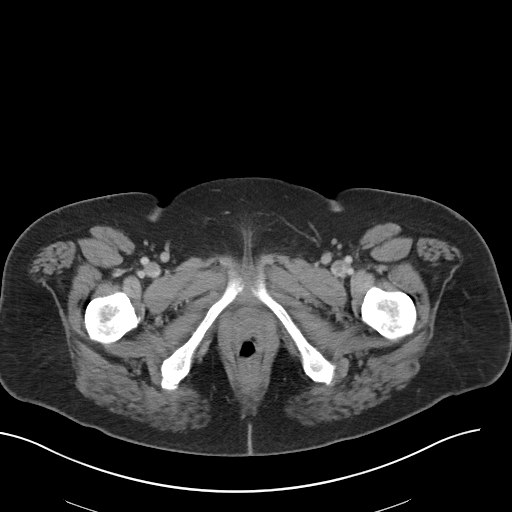
[im 6/99  bone]
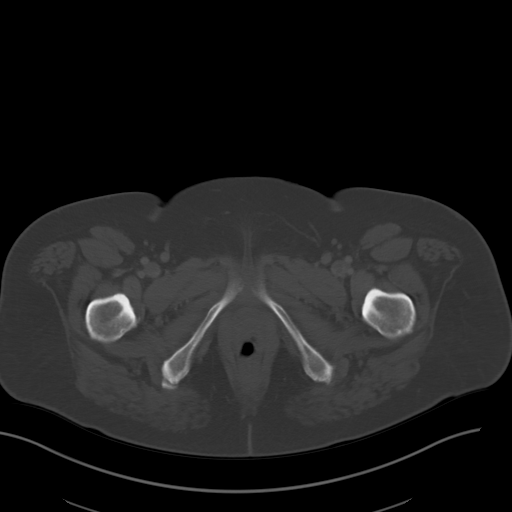
[im 16/99  soft-tissue]
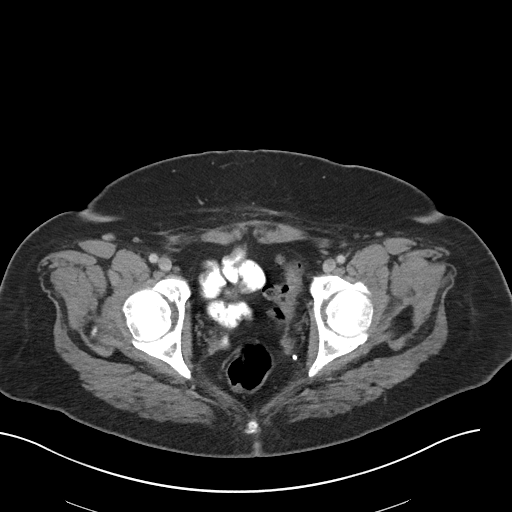
[im 21/99  soft-tissue]
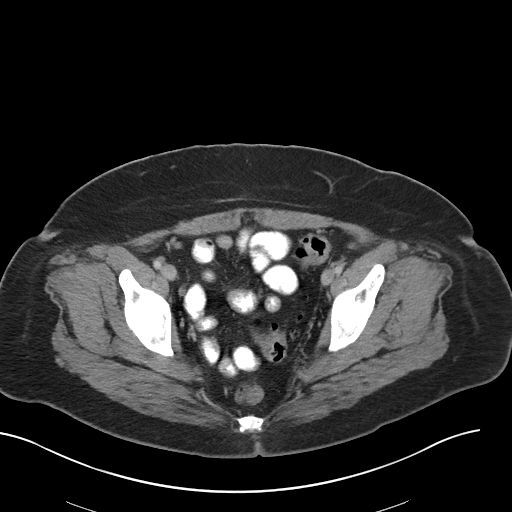
[im 26/99  soft-tissue]
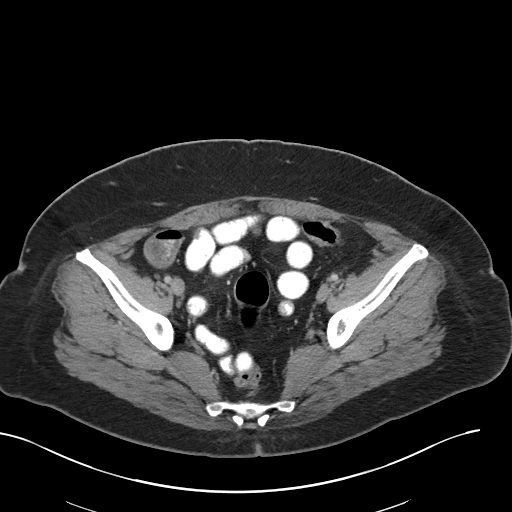
[im 37/99  soft-tissue]
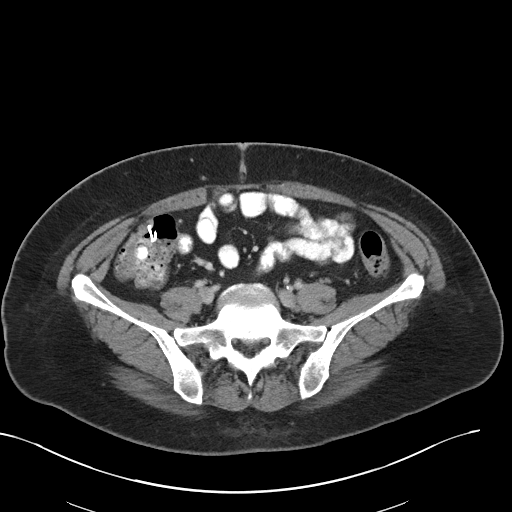
[im 42/99  soft-tissue]
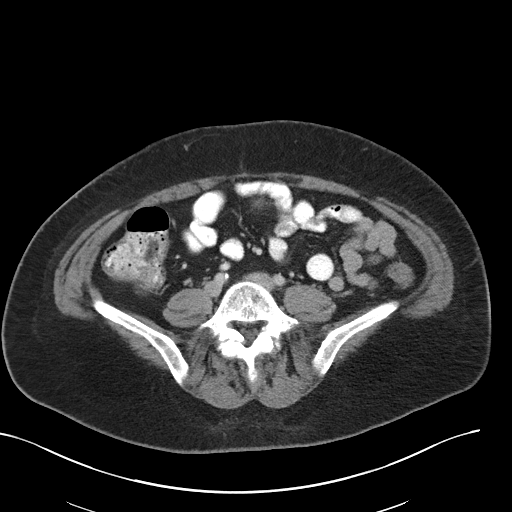
[im 52/99  soft-tissue]
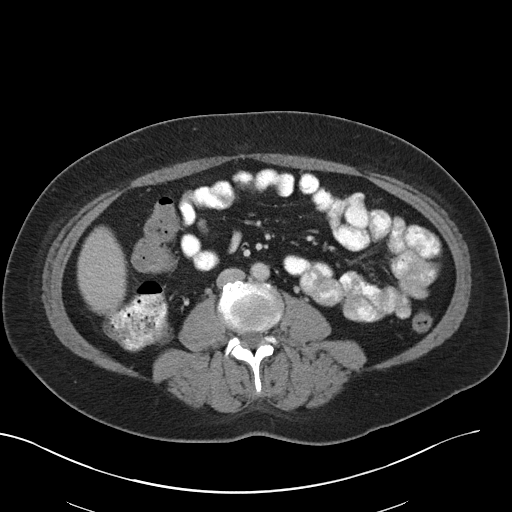
[im 57/99  soft-tissue]
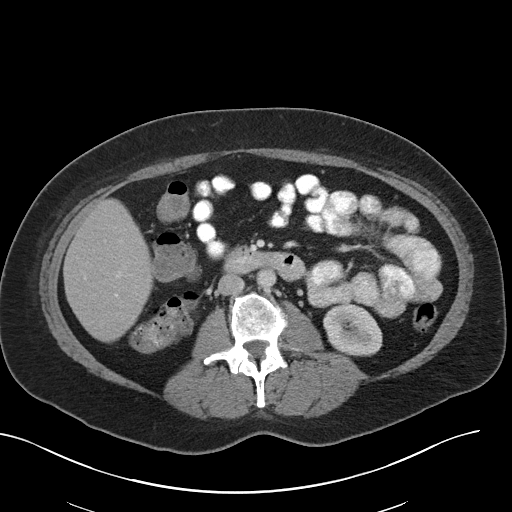
[im 62/99  soft-tissue]
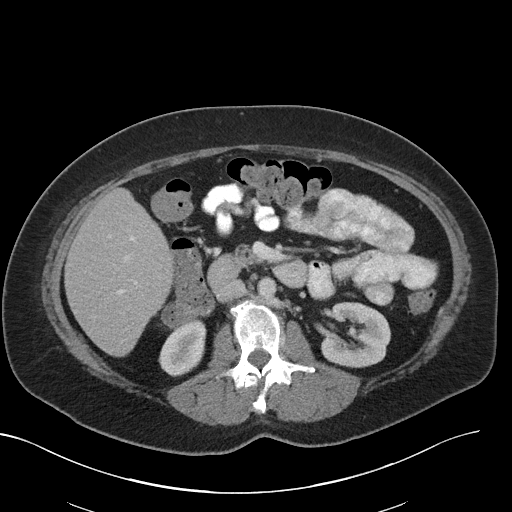
[im 62/99  bone]
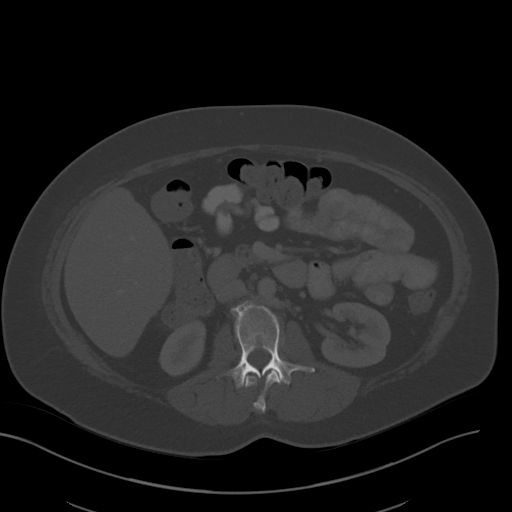
[im 73/99  soft-tissue]
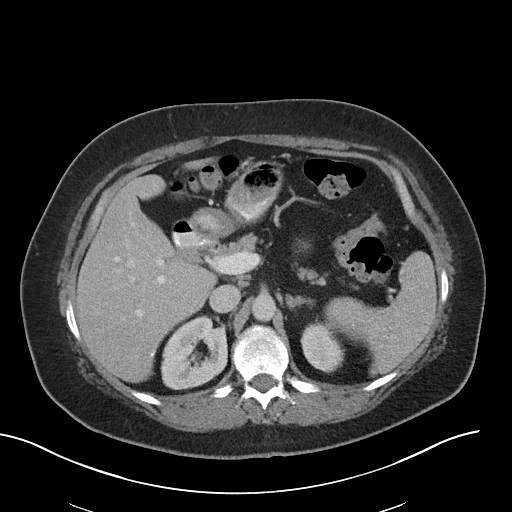
[im 78/99  soft-tissue]
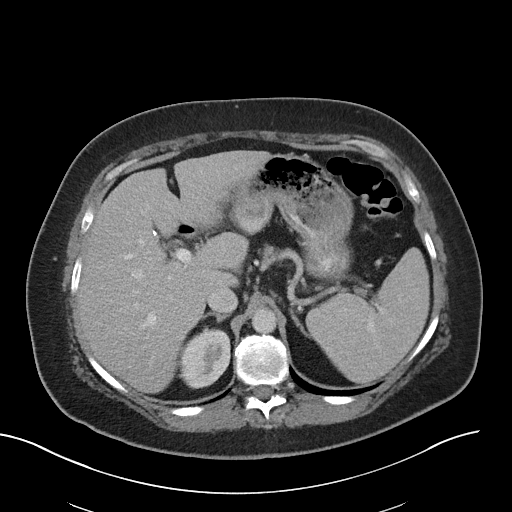
[im 83/99  soft-tissue]
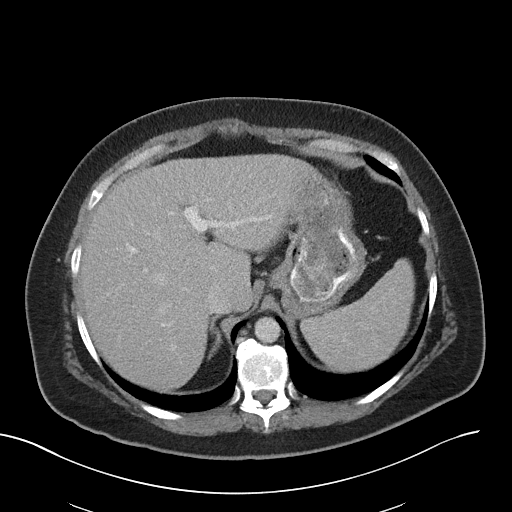
[im 93/99  soft-tissue]
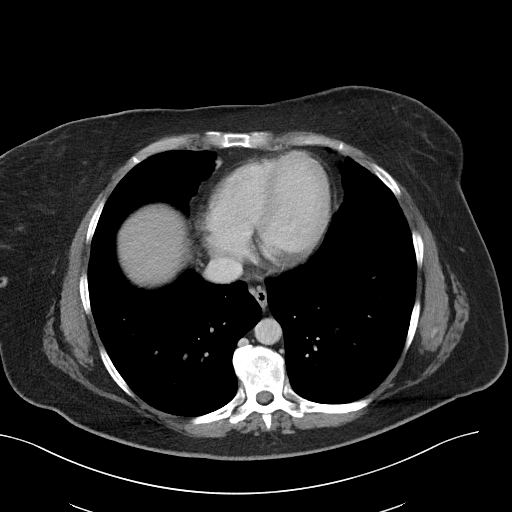

[Series 5: coronal soft tissue · coronal · 0.74mm/px · 3 of 93 slices shown]
[im 31/93  soft-tissue]
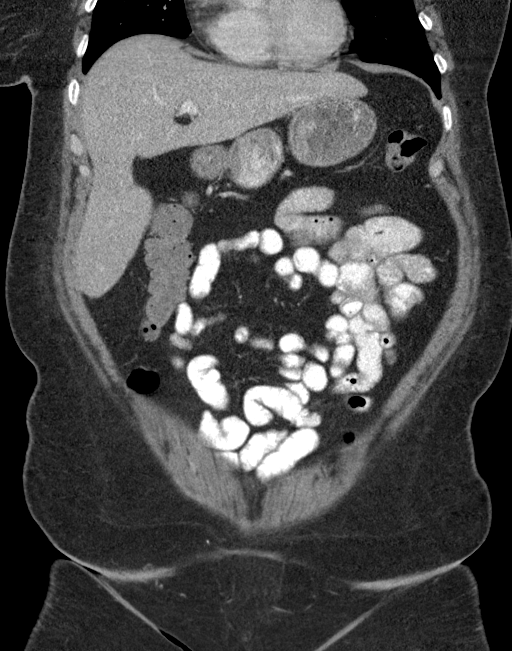
[im 41/93  soft-tissue]
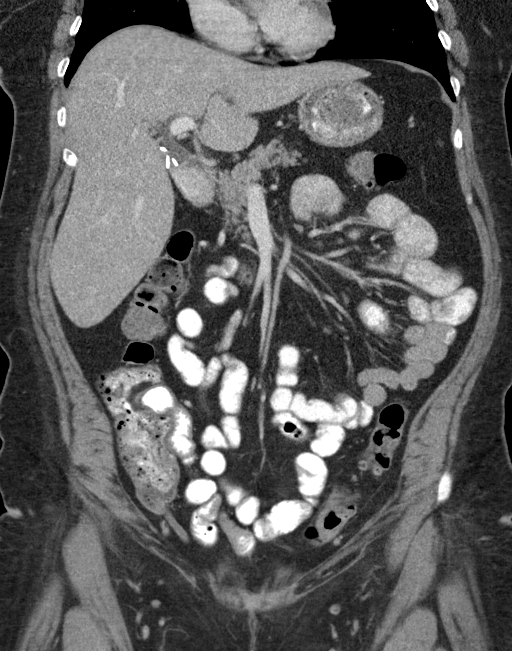
[im 52/93  soft-tissue]
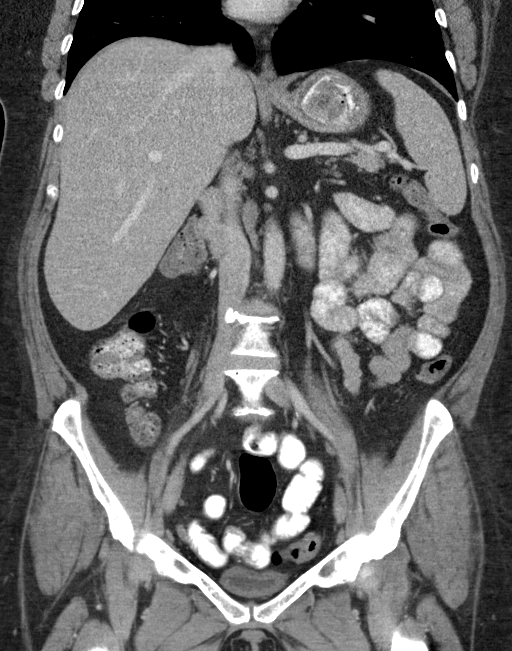

[16 of 46 positions shown; findings below may reference images not displayed]

FINDINGS: Lower chest:  Lung bases are clear.

Hepatobiliary: Liver is within normal limits. No
suspicious/enhancing hepatic lesions.

Status post cholecystectomy. No intrahepatic or extrahepatic ductal
dilatation.

Pancreas: Within normal limits.

Spleen: Within normal limits.

Adrenals/Urinary Tract: Adrenal glands are within normal limits.

Kidneys are within normal limits.  No hydronephrosis.

Bladder is mildly thick-walled although underdistended.

Stomach/Bowel: Stomach is within normal limits.

Small duodenal diverticulum at the junction of the 2nd and 3rd
portions of the duodenum.

Normal appendix (series 2/ image 82).

Surgical clips in the cecum (series 2/image 63).

Mild left colonic diverticulosis, without evidence of
diverticulitis.

Vascular/Lymphatic: No evidence of abdominal aortic aneurysm.

Retro aortic left renal vein.

No suspicious abdominopelvic lymphadenopathy.

Reproductive: Status post hysterectomy.

No adnexal masses.

Other: No abdominopelvic ascites.

No free air.

Musculoskeletal: Degenerative changes of the visualized
thoracolumbar spine.

Minimal spondylolisthesis at L5-S1.
IMPRESSION: No evidence of bowel obstruction.  Normal appendix.

Surgical clips in the cecum.

No free air.
